# Patient Record
Sex: Female | Born: 1970 | Race: White | Hispanic: No | Smoking: Never smoker
Health system: Southern US, Community
[De-identification: ages and names within clinical notes are randomized; demographics above are authoritative.]

## PROBLEM LIST (undated history)

## (undated) DIAGNOSIS — F32A Depression, unspecified: Secondary | ICD-10-CM

## (undated) DIAGNOSIS — F419 Anxiety disorder, unspecified: Secondary | ICD-10-CM

## (undated) DIAGNOSIS — IMO0002 Reserved for concepts with insufficient information to code with codable children: Secondary | ICD-10-CM

## (undated) DIAGNOSIS — F329 Major depressive disorder, single episode, unspecified: Secondary | ICD-10-CM

## (undated) HISTORY — PX: TUBAL LIGATION: SHX77

---

## 2011-12-27 ENCOUNTER — Encounter (HOSPITAL_COMMUNITY): Payer: Self-pay | Admitting: Emergency Medicine

## 2011-12-27 ENCOUNTER — Emergency Department (HOSPITAL_COMMUNITY)
Admission: EM | Admit: 2011-12-27 | Discharge: 2011-12-27 | Disposition: A | Discharge status: Home / Self Care | Payer: Medicaid Other | Attending: Emergency Medicine | Admitting: Emergency Medicine

## 2011-12-27 DIAGNOSIS — L02419 Cutaneous abscess of limb, unspecified: Secondary | ICD-10-CM | POA: Insufficient documentation

## 2011-12-27 DIAGNOSIS — L0291 Cutaneous abscess, unspecified: Secondary | ICD-10-CM

## 2011-12-27 HISTORY — DX: Reserved for concepts with insufficient information to code with codable children: IMO0002

## 2011-12-27 MED ORDER — LIDOCAINE HCL (PF) 1 % IJ SOLN
5.0000 mL | Freq: Once | INTRAMUSCULAR | Status: AC
  Administered 2011-12-27: 5 mL via INTRADERMAL
  Filled 2011-12-27: qty 5

## 2011-12-27 MED ORDER — DOXYCYCLINE HYCLATE 100 MG PO TABS
100.0000 mg | ORAL_TABLET | Freq: Once | ORAL | Status: AC
  Administered 2011-12-27: 100 mg via ORAL
  Filled 2011-12-27: qty 1

## 2011-12-27 MED ORDER — HYDROCODONE-ACETAMINOPHEN 5-325 MG PO TABS: ORAL_TABLET | ORAL | Status: AC

## 2011-12-27 MED ORDER — HYDROCODONE-ACETAMINOPHEN 5-325 MG PO TABS
1.0000 | ORAL_TABLET | Freq: Once | ORAL | Status: AC
  Administered 2011-12-27: 1 via ORAL
  Filled 2011-12-27: qty 1

## 2011-12-27 MED ORDER — DOXYCYCLINE HYCLATE 100 MG PO CAPS: 100.0000 mg | ORAL_CAPSULE | Freq: Two times a day (BID) | ORAL | Status: AC

## 2011-12-27 NOTE — ED Provider Notes (Signed)
History     CSN: [id number]  Arrival date & time 12/27/11  2107   First MD Initiated Contact with Patient 12/27/11 2141      Chief Complaint  Patient presents with  . Insect Bite    (Consider location/radiation/quality/duration/timing/severity/associated sxs/prior treatment) HPI Comments: Patient complains of a red swelling area to her right inner thigh. She states that she was bitten by a" brown spider" she did not actually see the spider bite her. She denies fever, chills, numbness or weakness. She has not taken any medications or applied any medications to the affected area.  Patient is a 41 y.o. female presenting with abscess. The history is provided by the patient.  Abscess  This is a new problem. The current episode started today. The onset was sudden. The problem occurs continuously. The problem has been gradually worsening. The abscess is present on the right upper leg. The problem is mild. The abscess is characterized by painfulness, swelling and redness. The patient was exposed to an insect bite/sting. The abscess first occurred at home. Pertinent negatives include no fever and no vomiting. Her past medical history does not include skin abscesses in family. There were no sick contacts. She has received no recent medical care.    Past Medical History  Diagnosis Date  . Herniated disc     History reviewed. No pertinent past surgical history.  History reviewed. No pertinent family history.  History  Substance Use Topics  . Smoking status: Never Smoker   . Smokeless tobacco: Not on file  . Alcohol Use: No    OB History    Grav Para Term Preterm Abortions TAB SAB Ect Mult Living                  Review of Systems  Constitutional: Negative for fever and chills.  Gastrointestinal: Negative for nausea and vomiting.  Musculoskeletal: Negative for joint swelling and arthralgias.  Skin: Positive for color change.       Abscess   Hematological: Negative for adenopathy.   All other systems reviewed and are negative.    Allergies  Review of patient's allergies indicates no known allergies.  Home Medications  No current outpatient prescriptions on file.  BP 133/79  Pulse 78  Temp 98.6 F (37 C) (Oral)  Resp 20  Ht 5\' 2"  (1.575 m)  Wt 140 lb (63.504 kg)  BMI 25.61 kg/m2  SpO2 99%  LMP 12/27/2011  Physical Exam  Nursing note and vitals reviewed. Constitutional: She is oriented to person, place, and time. She appears well-developed and well-nourished. No distress.  HENT:  Head: Normocephalic and atraumatic.  Cardiovascular: Normal rate, regular rhythm and normal heart sounds.   Pulmonary/Chest: Effort normal and breath sounds normal.  Neurological: She is alert and oriented to person, place, and time. She exhibits normal muscle tone. Coordination normal.  Skin: Skin is warm. There is erythema.          Abscess to the right medial thigh, moderate induration and surrounding erythema.  No drainage or lymphangitis    ED Course  Procedures (including critical care time)  Labs Reviewed - No data to display No results found.      MDM   INCISION AND DRAINAGE Performed by: Maxwell Caul. Consent: Verbal consent obtained. Risks and benefits: risks, benefits and alternatives were discussed Type: abscess  Body area: right inner thigh Anesthesia: local infiltration  Local anesthetic: lidocaine1% w/o epinephrine  Anesthetic total: 3 ml  Complexity: complex Blunt dissection to break up  loculations  Drainage: purulent  Drainage amount: moderate Packing material: 1/4 in iodoform gauze  Patient tolerance: Patient tolerated the procedure well with no immediate complications.    Patient is well-appearing, vital signs are stable. Nontoxic appearing .  She ambulates with a steady gait. Patient reports a history of a spider bite earlier today, but area appears more consistent with an abscess. She agrees to return here in 2 days for  recheck and packing removal  The patient appears reasonably screened and/or stabilized for discharge and I doubt any other medical condition or other [facility] requiring further screening, evaluation, or treatment in the ED at this time prior to discharge.   Prescribed: Doxycycline Norco #20      Archibald Marchetta L. Fed Ceci, [place] 12/27/11 2251

## 2011-12-27 NOTE — ED Notes (Signed)
Patient states she was bitten by a "brown" spider approximately 1 hour ago on the inside thigh of right leg. States area is swollen and painful.

## 2011-12-28 NOTE — ED Provider Notes (Signed)
Medical screening examination/treatment/procedure(s) were performed by non-physician practitioner and as supervising physician I was immediately available for consultation/collaboration.  Doug Sou, MD 12/28/11 0111

## 2011-12-30 ENCOUNTER — Encounter (HOSPITAL_COMMUNITY): Payer: Self-pay | Admitting: *Deleted

## 2011-12-30 ENCOUNTER — Emergency Department (HOSPITAL_COMMUNITY)
Admission: EM | Admit: 2011-12-30 | Discharge: 2011-12-30 | Disposition: A | Discharge status: Home / Self Care | Payer: Medicaid Other | Attending: Emergency Medicine | Admitting: Emergency Medicine

## 2011-12-30 DIAGNOSIS — L02419 Cutaneous abscess of limb, unspecified: Secondary | ICD-10-CM | POA: Insufficient documentation

## 2011-12-30 DIAGNOSIS — Z5189 Encounter for other specified aftercare: Secondary | ICD-10-CM

## 2011-12-30 DIAGNOSIS — Z4800 Encounter for change or removal of nonsurgical wound dressing: Secondary | ICD-10-CM | POA: Insufficient documentation

## 2011-12-30 DIAGNOSIS — L03119 Cellulitis of unspecified part of limb: Secondary | ICD-10-CM | POA: Insufficient documentation

## 2011-12-30 NOTE — ED Notes (Signed)
Pt was seen in er on Wednesday for ? Spider bite to right inner thigh area, pt states that the area was "opened" on Wednesday, is here today for recheck and ?packing removal, pt states that the redness has spread since being seen in er on Wednesday,

## 2011-12-30 NOTE — ED Provider Notes (Signed)
History     CSN: [id number]  Arrival date & time 12/30/11  0830   First MD Initiated Contact with Patient 12/30/11 [phone]      Chief Complaint  Patient presents with  . Wound Check    (Consider location/radiation/quality/duration/timing/severity/associated sxs/prior treatment) HPI Comments: Taking doxycycline   Patient is a 41 y.o. female presenting with wound check. The history is provided by the patient. No language interpreter was used.  Wound Check  She was treated in the ED 2 to 3 days ago. Treatments since wound repair include oral antibiotics. Her temperature was unmeasured prior to arrival. There has been colored discharge from the wound. The redness has not changed. The swelling has improved. The pain has improved.    Past Medical History  Diagnosis Date  . Herniated disc     History reviewed. No pertinent past surgical history.  No family history on file.  History  Substance Use Topics  . Smoking status: Never Smoker   . Smokeless tobacco: Not on file  . Alcohol Use: No    OB History    Grav Para Term Preterm Abortions TAB SAB Ect Mult Living                  Review of Systems  Constitutional: Negative for fever.  Skin: Positive for wound.  All other systems reviewed and are negative.    Allergies  Review of patient's allergies indicates no known allergies.  Home Medications   Current Outpatient Rx  Name Route Sig Dispense Refill  . DOXYCYCLINE HYCLATE 100 MG PO CAPS Oral Take 1 capsule (100 mg total) by mouth 2 (two) times daily. 20 capsule 0  . HYDROCODONE-ACETAMINOPHEN 5-325 MG PO TABS  Take one-two tabs po q 4-6 hrs prn pain 20 tablet 0    BP 116/71  Pulse 92  Temp 98 F (36.7 C) (Oral)  Resp 16  Ht 5\' 2"  (1.575 m)  Wt 140 lb (63.504 kg)  BMI 25.61 kg/m2  SpO2 96%  LMP 12/27/2011  Physical Exam  Nursing note and vitals reviewed. Constitutional: She is oriented to person, place, and time. She appears well-developed and  well-nourished. No distress.  HENT:  Head: Normocephalic and atraumatic.  Eyes: EOM are normal.  Neck: Normal range of motion.  Cardiovascular: Normal rate, regular rhythm and normal heart sounds.   Pulmonary/Chest: Effort normal and breath sounds normal.  Abdominal: Soft. She exhibits no distension. There is no tenderness.  Musculoskeletal: Normal range of motion.       Legs: Neurological: She is alert and oriented to person, place, and time.  Skin: Skin is warm and dry.  Psychiatric: She has a normal mood and affect. Judgment normal.    ED Course  Procedures (including critical care time)  Labs Reviewed - No data to display No results found.   1. Wound check, abscess     Using suture removal kit ~ 1 inch of packing removed by me.  Pt has been instructed to remove 1-1.5 inches of packing daily until gone.  MDM  Continue abs.  Remove 1-1.5 inches of packing daily until gone. Warm compresses.  Return prn.        Evalina Field, PA 12/30/11 0913  Evalina Field, PA 12/30/11 [phone]

## 2011-12-30 NOTE — ED Provider Notes (Signed)
Medical screening examination/treatment/procedure(s) were performed by non-physician practitioner and as supervising physician I was immediately available for consultation/collaboration.  Tyriana Helmkamp, MD 12/30/11 0945 

## 2011-12-30 NOTE — ED Notes (Signed)
Packing removal from right upper leg.  Area red, warm to touch.

## 2011-12-30 NOTE — ED Notes (Signed)
Pt able to state how to remove and cut the packing as needed for removal of 1-1.5 inch per day, pt verbalized understanding of discharge instructions.

## 2012-01-16 ENCOUNTER — Emergency Department (HOSPITAL_COMMUNITY): Payer: Medicaid Other

## 2012-01-16 ENCOUNTER — Encounter (HOSPITAL_COMMUNITY): Payer: Self-pay | Admitting: *Deleted

## 2012-01-16 ENCOUNTER — Emergency Department (HOSPITAL_COMMUNITY)
Admission: EM | Admit: 2012-01-16 | Discharge: 2012-01-16 | Disposition: A | Discharge status: Home / Self Care | Payer: Medicaid Other | Attending: Emergency Medicine | Admitting: Emergency Medicine

## 2012-01-16 DIAGNOSIS — S93409A Sprain of unspecified ligament of unspecified ankle, initial encounter: Secondary | ICD-10-CM | POA: Insufficient documentation

## 2012-01-16 DIAGNOSIS — X58XXXA Exposure to other specified factors, initial encounter: Secondary | ICD-10-CM | POA: Insufficient documentation

## 2012-01-16 DIAGNOSIS — S93401A Sprain of unspecified ligament of right ankle, initial encounter: Secondary | ICD-10-CM

## 2012-01-16 MED ORDER — TRAMADOL HCL 50 MG PO TABS: 50.0000 mg | ORAL_TABLET | Freq: Four times a day (QID) | ORAL | Status: AC | PRN

## 2012-01-16 NOTE — ED Notes (Signed)
Pt c/o pain to right ankle and foot, states that she twisted her right foot and ankle three days ago and continues to have pain.

## 2012-01-16 NOTE — ED Notes (Signed)
Pain rt foot for 3 days , twisted rt foot and ankle, Pain "shoots up my leg"  No swelling.

## 2012-01-16 NOTE — ED Provider Notes (Signed)
History     CSN: [id number]  Arrival date & time 01/16/12  1639   First MD Initiated Contact with Patient 01/16/12 1740      Chief Complaint  Patient presents with  . Foot Pain    (Consider location/radiation/quality/duration/timing/severity/associated sxs/prior treatment) Patient is a 41 y.o. female presenting with lower extremity pain. The history is provided by the patient.  Foot Pain This is a new problem. The current episode started in the past 7 days. The problem occurs daily. The problem has been gradually worsening. Associated symptoms include joint swelling. Pertinent negatives include no abdominal pain, arthralgias, chest pain, coughing, neck pain or numbness. The symptoms are aggravated by standing, walking and twisting. She has tried nothing for the symptoms. The treatment provided no relief.    Past Medical History  Diagnosis Date  . Herniated disc     History reviewed. No pertinent past surgical history.  History reviewed. No pertinent family history.  History  Substance Use Topics  . Smoking status: Never Smoker   . Smokeless tobacco: Not on file  . Alcohol Use: No    OB History    Grav Para Term Preterm Abortions TAB SAB Ect Mult Living                  Review of Systems  Constitutional: Negative for activity change.       All ROS Neg except as noted in HPI  HENT: Negative for nosebleeds and neck pain.   Eyes: Negative for photophobia and discharge.  Respiratory: Negative for cough, shortness of breath and wheezing.   Cardiovascular: Negative for chest pain and palpitations.  Gastrointestinal: Negative for abdominal pain and blood in stool.  Genitourinary: Negative for dysuria, frequency and hematuria.  Musculoskeletal: Positive for joint swelling. Negative for back pain and arthralgias.  Skin: Negative.   Neurological: Negative for dizziness, seizures, speech difficulty and numbness.  Psychiatric/Behavioral: Negative for hallucinations and  confusion.    Allergies  Flexeril; Motrin; and Tylenol  Home Medications  No current outpatient prescriptions on file.  BP 153/97  Pulse 68  Temp 98.2 F (36.8 C) (Oral)  Resp 20  Ht 5\' 2"  (1.575 m)  Wt 140 lb (63.504 kg)  BMI 25.61 kg/m2  SpO2 99%  LMP 01/11/2012  Physical Exam  Nursing note and vitals reviewed. Constitutional: She is oriented to person, place, and time. She appears well-developed and well-nourished.  Non-toxic appearance.  HENT:  Head: Normocephalic.  Right Ear: Tympanic membrane and external ear normal.  Left Ear: Tympanic membrane and external ear normal.  Eyes: EOM and lids are normal. Pupils are equal, round, and reactive to light.  Neck: Normal range of motion. Neck supple. Carotid bruit is not present.  Cardiovascular: Normal rate, regular rhythm, normal heart sounds, intact distal pulses and normal pulses.   Pulmonary/Chest: Breath sounds normal. No respiratory distress.  Abdominal: Soft. Bowel sounds are normal. There is no tenderness. There is no guarding.  Musculoskeletal: Normal range of motion.       There is pain and mild swelling of the lateral malleolus on the right. There is full range of motion of the right knee and hip. There is no tibia or fibula deformity. The Achilles tendon is intact the capillary refill on the right is less than 3 seconds. Sensory is intact  Lymphadenopathy:       Head (right side): No submandibular adenopathy present.       Head (left side): No submandibular adenopathy present.  She has no cervical adenopathy.  Neurological: She is alert and oriented to person, place, and time. She has normal strength. No cranial nerve deficit or sensory deficit.  Skin: Skin is warm and dry.  Psychiatric: She has a normal mood and affect. Her speech is normal.    ED Course  Procedures (including critical care time)  Labs Reviewed - No data to display Dg Ankle Complete Right  01/16/2012  *RADIOLOGY REPORT*  Clinical Data:  Twisting injury to the right foot and ankle 3 days ago, persistent pain.  RIGHT ANKLE - COMPLETE 3+ VIEW  Comparison: Right foot x-rays obtained concurrently.  Findings: No evidence of acute, subacute, or healed fractures. Ankle mortise intact with well-preserved joint space.  No intrinsic osseous abnormalities.  No evidence of a significant joint effusion.  IMPRESSION: Normal examination.   Original Report Authenticated By: Arnell Sieving, M.D.    Dg Foot Complete Right  01/16/2012  *RADIOLOGY REPORT*  Clinical Data: Twisting injury to the right foot and ankle 3 days ago, persistent pain.  RIGHT FOOT COMPLETE - 3+ VIEW  Comparison: Right ankle x-rays obtained concurrently.  Findings: No evidence of acute or subacute fracture or dislocation. Well-preserved joint spaces.  Well-preserved bone mineral density. No intrinsic osseous abnormalities.  IMPRESSION: Normal examination.   Original Report Authenticated By: Arnell Sieving, M.D.      1. Sprain of ankle, right       MDM  I have reviewed nursing notes, vital signs, and all appropriate lab and imaging results for this patient.  The x-ray of the right ankle is negative for fracture or dislocation the ankle mortise is intact the. The x-ray of the right foot shows no fracture or dislocation. Patient is fitted with an ankle stirrup splint. Ice pack is applied. Patient is advised to see her primary physician or the physician if not improving.      Kathie Dike, [place] 01/16/12 1758

## 2012-01-17 NOTE — ED Provider Notes (Signed)
Medical screening examination/treatment/procedure(s) were performed by non-physician practitioner and as supervising physician I was immediately available for consultation/collaboration.   Joya Gaskins, MD 01/17/12 [phone]

## 2012-02-09 ENCOUNTER — Emergency Department (HOSPITAL_COMMUNITY)
Admission: EM | Admit: 2012-02-09 | Discharge: 2012-02-09 | Disposition: A | Discharge status: Home / Self Care | Payer: Medicaid Other | Attending: Emergency Medicine | Admitting: Emergency Medicine

## 2012-02-09 ENCOUNTER — Encounter (HOSPITAL_COMMUNITY): Payer: Self-pay | Admitting: *Deleted

## 2012-02-09 DIAGNOSIS — L02419 Cutaneous abscess of limb, unspecified: Secondary | ICD-10-CM

## 2012-02-09 DIAGNOSIS — IMO0002 Reserved for concepts with insufficient information to code with codable children: Secondary | ICD-10-CM | POA: Insufficient documentation

## 2012-02-09 DIAGNOSIS — Z888 Allergy status to other drugs, medicaments and biological substances status: Secondary | ICD-10-CM | POA: Insufficient documentation

## 2012-02-09 MED ORDER — LIDOCAINE HCL (PF) 1 % IJ SOLN
INTRAMUSCULAR | Status: AC
  Filled 2012-02-09: qty 5

## 2012-02-09 MED ORDER — DOXYCYCLINE HYCLATE 100 MG PO TABS
100.0000 mg | ORAL_TABLET | Freq: Once | ORAL | Status: AC
  Administered 2012-02-09: 100 mg via ORAL
  Filled 2012-02-09: qty 1

## 2012-02-09 MED ORDER — HYDROCODONE-ACETAMINOPHEN 5-325 MG PO TABS: 1.0000 | ORAL_TABLET | Freq: Four times a day (QID) | ORAL | Status: AC | PRN

## 2012-02-09 MED ORDER — HYDROCODONE-ACETAMINOPHEN 5-325 MG PO TABS
1.0000 | ORAL_TABLET | Freq: Once | ORAL | Status: AC
  Administered 2012-02-09: 1 via ORAL
  Filled 2012-02-09: qty 1

## 2012-02-09 MED ORDER — DOXYCYCLINE HYCLATE 100 MG PO CAPS: 100.0000 mg | ORAL_CAPSULE | Freq: Two times a day (BID) | ORAL | Status: DC

## 2012-02-09 NOTE — ED Provider Notes (Signed)
History     CSN: [id number]  Arrival date & time 02/09/12  1630   First MD Initiated Contact with Patient 02/09/12 1723      Chief Complaint  Patient presents with  . Abscess    (Consider location/radiation/quality/duration/timing/severity/associated sxs/prior treatment) HPI Comments: Swelling and pain for 2 days.  No fever or chills.  Has had one previous abscess.  Patient is a 41 y.o. female presenting with abscess. The history is provided by the patient. No language interpreter was used.  Abscess  This is a new problem. Episode onset: 2 days ago. The problem occurs continuously. The problem has been gradually worsening. Affected Location: R axilla. The problem is moderate. The abscess is characterized by painfulness and redness. The abscess first occurred at home. Pertinent negatives include no fever. Her past medical history does not include atopy in family or skin abscesses in family. There were no sick contacts. She has received no recent medical care.    Past Medical History  Diagnosis Date  . Herniated disc     History reviewed. No pertinent past surgical history.  History reviewed. No pertinent family history.  History  Substance Use Topics  . Smoking status: Never Smoker   . Smokeless tobacco: Not on file  . Alcohol Use: No    OB History    Grav Para Term Preterm Abortions TAB SAB Ect Mult Living                  Review of Systems  Constitutional: Negative for fever and chills.  Skin:       Abscess   All other systems reviewed and are negative.    Allergies  Flexeril; Motrin; and Tylenol  Home Medications   Current Outpatient Rx  Name Route Sig Dispense Refill  . DOXYCYCLINE HYCLATE 100 MG PO CAPS Oral Take 1 capsule (100 mg total) by mouth 2 (two) times daily. 20 capsule 0  . HYDROCODONE-ACETAMINOPHEN 5-325 MG PO TABS Oral Take 1 tablet by mouth every 6 (six) hours as needed for pain. 20 tablet 0    BP 134/88  Pulse 69  Temp 98.4 F (36.9  C) (Oral)  Resp 18  Ht 5\' 2"  (1.575 m)  Wt 140 lb (63.504 kg)  BMI 25.61 kg/m2  SpO2 100%  LMP 02/05/2012  Physical Exam  Nursing note and vitals reviewed. Constitutional: She is oriented to person, place, and time. She appears well-developed and well-nourished. No distress.  HENT:  Head: Normocephalic and atraumatic.  Eyes: EOM are normal.  Neck: Normal range of motion.  Cardiovascular: Normal rate, regular rhythm and normal heart sounds.   Pulmonary/Chest: Effort normal and breath sounds normal.  Abdominal: Soft. She exhibits no distension. There is no tenderness.  Musculoskeletal: Normal range of motion. She exhibits tenderness.  Neurological: She is alert and oriented to person, place, and time.  Skin: Skin is warm and dry.       ~ 2 x 4 cm area of redness and swelling with PT to R axilla.  + induration.  No fluctuance.  Psychiatric: She has a normal mood and affect. Judgment normal.    ED Course  Procedures (including critical care time)  Labs Reviewed - No data to display No results found.   1. Axillary abscess       MDM  rx-doxycycline, 20 Rx=-hydrocodone, 20 Warm compresses.  Return for I&D as needed.        Evalina Field, [place] 02/09/12 1743

## 2012-02-09 NOTE — ED Notes (Signed)
Patient with no complaints at this time. Respirations even and unlabored. Skin warm/dry. Discharge instructions reviewed with patient at this time. Patient given opportunity to voice concerns/ask questions. Patient discharged at this time and left Emergency Department with steady gait.   

## 2012-02-09 NOTE — ED Notes (Signed)
Swelling , redness of rt axilla.

## 2012-02-10 NOTE — ED Provider Notes (Signed)
Medical screening examination/treatment/procedure(s) were performed by non-physician practitioner and as supervising physician I was immediately available for consultation/collaboration.   Mataeo Ingwersen M Emelia Sandoval, MD 02/10/12 1752 

## 2012-03-25 ENCOUNTER — Encounter (HOSPITAL_COMMUNITY): Payer: Self-pay | Admitting: Emergency Medicine

## 2012-03-25 ENCOUNTER — Emergency Department (HOSPITAL_COMMUNITY)
Admission: EM | Admit: 2012-03-25 | Discharge: 2012-03-25 | Disposition: A | Discharge status: Home / Self Care | Payer: Medicaid Other | Attending: Emergency Medicine | Admitting: Emergency Medicine

## 2012-03-25 DIAGNOSIS — IMO0002 Reserved for concepts with insufficient information to code with codable children: Secondary | ICD-10-CM | POA: Insufficient documentation

## 2012-03-25 DIAGNOSIS — L0291 Cutaneous abscess, unspecified: Secondary | ICD-10-CM

## 2012-03-25 MED ORDER — HYDROCODONE-ACETAMINOPHEN 5-325 MG PO TABS: ORAL_TABLET | ORAL | Status: DC

## 2012-03-25 MED ORDER — SULFAMETHOXAZOLE-TRIMETHOPRIM 800-160 MG PO TABS: 1.0000 | ORAL_TABLET | Freq: Two times a day (BID) | ORAL | Status: DC

## 2012-03-25 MED ORDER — LIDOCAINE HCL (PF) 1 % IJ SOLN
5.0000 mL | Freq: Once | INTRAMUSCULAR | Status: AC
  Administered 2012-03-25: 5 mL via INTRADERMAL

## 2012-03-25 MED ORDER — LIDOCAINE HCL (PF) 1 % IJ SOLN
INTRAMUSCULAR | Status: AC
  Filled 2012-03-25: qty 5

## 2012-03-25 NOTE — ED Notes (Signed)
Abscess to right arm pit. Swollen and painful today

## 2012-03-25 NOTE — ED Notes (Signed)
Pt c/o abscess to the rt arm pit.

## 2012-03-25 NOTE — ED Provider Notes (Signed)
History     CSN: [id number]  Arrival date & time 03/25/12  1313   First MD Initiated Contact with Patient 03/25/12 1401      Chief Complaint  Patient presents with  . Abscess    (Consider location/radiation/quality/duration/timing/severity/associated sxs/prior treatment) Patient is a 41 y.o. female presenting with abscess. The history is provided by the patient.  Abscess  This is a new problem. The current episode started less than one week ago. The onset was gradual. The problem occurs continuously. The problem has been gradually worsening. Affected Location: right axilla. The problem is moderate. The abscess is characterized by painfulness, swelling and redness. Pertinent negatives include no decrease in physical activity, no fever, no vomiting and no decreased responsiveness. Her past medical history is significant for skin abscesses in family. There were no sick contacts. She has received no recent medical care.    Past Medical History  Diagnosis Date  . Herniated disc     History reviewed. No pertinent past surgical history.  History reviewed. No pertinent family history.  History  Substance Use Topics  . Smoking status: Never Smoker   . Smokeless tobacco: Not on file  . Alcohol Use: No    OB History    Grav Para Term Preterm Abortions TAB SAB Ect Mult Living                  Review of Systems  Constitutional: Negative for fever, chills and decreased responsiveness.  Gastrointestinal: Negative for nausea, vomiting and abdominal pain.  Musculoskeletal: Negative for joint swelling and arthralgias.  Skin: Positive for color change.       Abscess   Neurological: Negative for dizziness and headaches.  Hematological: Negative for adenopathy.  All other systems reviewed and are negative.    Allergies  Flexeril  Home Medications  No current outpatient prescriptions on file.  BP 123/80  Pulse 86  Temp 98 F (36.7 C) (Oral)  Resp 17  Ht 5\' 2"  (1.575 m)   Wt 140 lb (63.504 kg)  BMI 25.61 kg/m2  SpO2 99%  LMP 03/19/2012  Physical Exam  Nursing note and vitals reviewed. Constitutional: She is oriented to person, place, and time. She appears well-developed and well-nourished. No distress.  HENT:  Head: Normocephalic and atraumatic.  Cardiovascular: Normal rate, regular rhythm and normal heart sounds.   Pulmonary/Chest: Effort normal and breath sounds normal.  Neurological: She is alert and oriented to person, place, and time. She exhibits normal muscle tone. Coordination normal.  Skin: Skin is warm. There is erythema.       Abscess to the right axilla with moderate induration and surrounding erythema.  No drainage    ED Course  Procedures (including critical care time)  Labs Reviewed - No data to display No results found.      MDM    INCISION AND DRAINAGE Performed by: Maxwell Caul. Consent: Verbal consent obtained. Risks and benefits: risks, benefits and alternatives were discussed Type: abscess  Body area: right axilla Anesthesia: local infiltration  Local anesthetic: lidocaine 1 % w/o  epinephrine  Anesthetic total: 3 ml  Complexity: complex Blunt dissection to break up loculations  Drainage: purulent  Drainage amount:moderate  Packing material: 1/4 in iodoform gauze  Patient tolerance: Patient tolerated the procedure well with no immediate complications.    Pt is well appearing, vitals stable,  has hx of same.  Prefers to remove packing herself in 2 days.  She agrees to return here if sx's are worsening  Prescribed: norco #20 Bactrim DS   Alvah Lagrow L. Diva Lemberger, [place] 03/27/12 1535

## 2012-03-28 NOTE — ED Provider Notes (Signed)
Medical screening examination/treatment/procedure(s) were performed by non-physician practitioner and as supervising physician I was immediately available for consultation/collaboration.  Anyssa Sharpless, MD 03/28/12 0743 

## 2012-05-12 ENCOUNTER — Emergency Department (HOSPITAL_COMMUNITY)
Admission: EM | Admit: 2012-05-12 | Discharge: 2012-05-13 | Disposition: A | Discharge status: Home / Self Care | Payer: Medicaid Other | Attending: Emergency Medicine | Admitting: Emergency Medicine

## 2012-05-12 ENCOUNTER — Encounter (HOSPITAL_COMMUNITY): Payer: Self-pay | Admitting: *Deleted

## 2012-05-12 DIAGNOSIS — Z8739 Personal history of other diseases of the musculoskeletal system and connective tissue: Secondary | ICD-10-CM | POA: Insufficient documentation

## 2012-05-12 DIAGNOSIS — Z79899 Other long term (current) drug therapy: Secondary | ICD-10-CM | POA: Insufficient documentation

## 2012-05-12 DIAGNOSIS — L02412 Cutaneous abscess of left axilla: Secondary | ICD-10-CM

## 2012-05-12 DIAGNOSIS — IMO0002 Reserved for concepts with insufficient information to code with codable children: Secondary | ICD-10-CM | POA: Insufficient documentation

## 2012-05-12 NOTE — ED Notes (Signed)
Pt has abscess under left arm pit.  

## 2012-05-13 MED ORDER — HYDROCODONE-ACETAMINOPHEN 5-325 MG PO TABS: 1.0000 | ORAL_TABLET | ORAL | Status: AC | PRN

## 2012-05-13 MED ORDER — SULFAMETHOXAZOLE-TRIMETHOPRIM 800-160 MG PO TABS: 1.0000 | ORAL_TABLET | Freq: Two times a day (BID) | ORAL | Status: DC

## 2012-05-13 MED ORDER — LIDOCAINE HCL (PF) 2 % IJ SOLN
10.0000 mL | Freq: Once | INTRAMUSCULAR | Status: DC
  Filled 2012-05-13 (×2): qty 10

## 2012-05-13 MED ORDER — SULFAMETHOXAZOLE-TMP DS 800-160 MG PO TABS
1.0000 | ORAL_TABLET | Freq: Once | ORAL | Status: AC
  Administered 2012-05-13: 1 via ORAL
  Filled 2012-05-13: qty 1

## 2012-05-13 MED ORDER — HYDROCODONE-ACETAMINOPHEN 5-325 MG PO TABS
1.0000 | ORAL_TABLET | Freq: Once | ORAL | Status: AC
  Administered 2012-05-13: 1 via ORAL
  Filled 2012-05-13: qty 1

## 2012-05-13 NOTE — ED Provider Notes (Signed)
History     CSN: [id number]  Arrival date & time 05/12/12  2109   First MD Initiated Contact with Patient 05/13/12 0005      Chief Complaint  Patient presents with  . Abscess    (Consider location/radiation/quality/duration/timing/severity/associated sxs/prior treatment) Patient is a 42 y.o. female presenting with abscess. The history is provided by the patient.  Abscess  This is a recurrent problem. The onset was gradual. The problem occurs continuously. The problem has been gradually worsening. Affected Location: left axilla. The problem is moderate. The abscess is characterized by redness, painfulness and swelling. Pertinent negatives include no fever. Past medical history comments: Pt has positive personal history of frequent axillary abscesses..    Past Medical History  Diagnosis Date  . Herniated disc     History reviewed. No pertinent past surgical history.  History reviewed. No pertinent family history.  History  Substance Use Topics  . Smoking status: Never Smoker   . Smokeless tobacco: Not on file  . Alcohol Use: No    OB History    Grav Para Term Preterm Abortions TAB SAB Ect Mult Living                  Review of Systems  Constitutional: Negative for fever and chills.  HENT: Negative for facial swelling.   Respiratory: Negative for shortness of breath and wheezing.   Skin: Positive for color change and wound.  Neurological: Negative for numbness.    Allergies  Flexeril  Home Medications   Current Outpatient Rx  Name  Route  Sig  Dispense  Refill  . METOPROLOL TARTRATE 25 MG PO TABS   Oral   Take 25 mg by mouth 2 (two) times daily.         Marland Kitchen HYDROCODONE-ACETAMINOPHEN 5-325 MG PO TABS   Oral   Take 1 tablet by mouth every 4 (four) hours as needed for pain.   15 tablet   0   . SULFAMETHOXAZOLE-TRIMETHOPRIM 800-160 MG PO TABS   Oral   Take 1 tablet by mouth 2 (two) times daily.   28 tablet   0     BP 140/84  Pulse 84  Temp 98.2 F  (36.8 C)  Resp 20  Ht 5\' 2"  (1.575 m)  Wt 140 lb (63.504 kg)  BMI 25.61 kg/m2  SpO2 99%  LMP 05/07/2012  Physical Exam  Constitutional: She appears well-developed and well-nourished. No distress.  HENT:  Head: Normocephalic.  Neck: Neck supple.  Cardiovascular: Normal rate.   Pulmonary/Chest: Effort normal. She has no wheezes.  Musculoskeletal: Normal range of motion. She exhibits no edema.  Skin: Skin is warm and dry. There is erythema.       Abscess x 2 left mid axilla.  No active drainage,  No red streaking,  Surrounding erythema.  Fluctuant.    ED Course  Procedures (including critical care time)   Labs Reviewed  WOUND CULTURE   No results found.   1. Abscess of axilla, left    INCISION AND DRAINAGE Performed by: Burgess Amor Consent: Verbal consent obtained. Risks and benefits: risks, benefits and alternatives were discussed Type: abscess  Body area: left axilla  Anesthesia: local infiltration  Incision was made with a scalpel.  Local anesthetic: lidocaine 2% without epinephrine  Anesthetic total: 10 mL - 4 mL injected around site,  6 mL flush to interior cavity.  Complexity: complex Blunt dissection to break up loculations  Drainage: purulent  Drainage amount: moderate  Packing material: 1/4  in iodoform gauze  Patient tolerance: Patient tolerated the procedure well with no immediate complications.      MDM  Bactrim,  Warm water therapy.  Pt comfortable removing packing in 2 days. Prn f/u.  Wound culture sent.        Burgess Amor, PA 05/13/12 1331

## 2012-05-14 NOTE — ED Provider Notes (Signed)
Medical screening examination/treatment/procedure(s) were performed by non-physician practitioner and as supervising physician I was immediately available for consultation/collaboration.  Nicoletta Dress. Colon Branch, MD 05/14/12 4098

## 2012-05-16 ENCOUNTER — Telehealth (HOSPITAL_COMMUNITY): Payer: Self-pay | Admitting: Emergency Medicine

## 2012-05-16 LAB — WOUND CULTURE

## 2012-05-16 NOTE — ED Notes (Signed)
Results called from [facility].  Wound L Axilla -> (+) Moderate MRSA.  Rx given in ED for Sulfa-trimeth -> sensitive to the same.  Call and notify pt.  General FYI set for MRSA Hx

## 2012-05-17 ENCOUNTER — Telehealth (HOSPITAL_COMMUNITY): Payer: Self-pay | Admitting: Emergency Medicine

## 2012-05-19 NOTE — ED Notes (Signed)
Unable to contact patient via phone. Sent letter. °

## 2012-06-17 ENCOUNTER — Telehealth (HOSPITAL_COMMUNITY): Payer: Self-pay | Admitting: Emergency Medicine

## 2012-06-17 NOTE — ED Notes (Signed)
No response to letter sent after 30 days. Chart sent to Medical Records. °

## 2012-07-24 ENCOUNTER — Encounter (HOSPITAL_COMMUNITY): Payer: Self-pay

## 2012-07-24 ENCOUNTER — Emergency Department (HOSPITAL_COMMUNITY)
Admission: EM | Admit: 2012-07-24 | Discharge: 2012-07-24 | Disposition: A | Discharge status: Home / Self Care | Payer: Medicaid Other | Attending: Emergency Medicine | Admitting: Emergency Medicine

## 2012-07-24 DIAGNOSIS — R300 Dysuria: Secondary | ICD-10-CM | POA: Insufficient documentation

## 2012-07-24 DIAGNOSIS — Z8739 Personal history of other diseases of the musculoskeletal system and connective tissue: Secondary | ICD-10-CM | POA: Insufficient documentation

## 2012-07-24 DIAGNOSIS — Z3202 Encounter for pregnancy test, result negative: Secondary | ICD-10-CM | POA: Insufficient documentation

## 2012-07-24 DIAGNOSIS — Z79899 Other long term (current) drug therapy: Secondary | ICD-10-CM | POA: Insufficient documentation

## 2012-07-24 DIAGNOSIS — N739 Female pelvic inflammatory disease, unspecified: Secondary | ICD-10-CM | POA: Insufficient documentation

## 2012-07-24 DIAGNOSIS — N898 Other specified noninflammatory disorders of vagina: Secondary | ICD-10-CM | POA: Insufficient documentation

## 2012-07-24 LAB — URINALYSIS, ROUTINE W REFLEX MICROSCOPIC
Glucose, UA: NEGATIVE mg/dL
Ketones, ur: NEGATIVE mg/dL
Leukocytes, UA: NEGATIVE
Nitrite: NEGATIVE
Protein, ur: NEGATIVE mg/dL
pH: 6.5 (ref 5.0–8.0)

## 2012-07-24 LAB — POCT PREGNANCY, URINE: Preg Test, Ur: NEGATIVE

## 2012-07-24 LAB — WET PREP, GENITAL: Trich, Wet Prep: NONE SEEN

## 2012-07-24 MED ORDER — CEFTRIAXONE SODIUM 250 MG IJ SOLR
250.0000 mg | Freq: Once | INTRAMUSCULAR | Status: AC
  Administered 2012-07-24: 250 mg via INTRAMUSCULAR
  Filled 2012-07-24: qty 250

## 2012-07-24 MED ORDER — HYDROCODONE-ACETAMINOPHEN 5-325 MG PO TABS: ORAL_TABLET | ORAL | Status: DC

## 2012-07-24 MED ORDER — METRONIDAZOLE 500 MG PO TABS: 500.0000 mg | ORAL_TABLET | Freq: Two times a day (BID) | ORAL | Status: DC

## 2012-07-24 MED ORDER — DOXYCYCLINE HYCLATE 100 MG PO CAPS: 100.0000 mg | ORAL_CAPSULE | Freq: Two times a day (BID) | ORAL | Status: DC

## 2012-07-24 NOTE — ED Notes (Signed)
Pt c/o vaginal pain and burning with urination x 3 hours.  Denies any vaginal discharge or bleeding.

## 2012-07-24 NOTE — ED Provider Notes (Signed)
History     CSN: [id number]  Arrival date & time 07/24/12  1602   First MD Initiated Contact with Patient 07/24/12 1704      Chief Complaint  Patient presents with  . 74.5     (Consider location/radiation/quality/duration/timing/severity/associated sxs/prior treatment) HPI Comments: Patient c/o vaginal pain with burning with urination for several hours.  States she has noticed a slight increase in vaginal discharge.  She denies bleeding, abd pain, fever or vomiting.  She admits to unprotected sexual activity.    Patient is a 42 y.o. female presenting with dysuria. The history is provided by the patient.  Dysuria  This is a new problem. The current episode started 3 to 5 hours ago. The problem occurs every urination. The problem has not changed since onset.The quality of the pain is described as burning. The pain is mild. There has been no fever. She is sexually active. There is no history of pyelonephritis. Pertinent negatives include no chills, no sweats, no nausea, no vomiting, no discharge, no frequency, no hematuria, no hesitancy, no possible pregnancy, no urgency and no flank pain. She has tried nothing for the symptoms. Her past medical history does not include kidney stones, recurrent UTIs or urinary stasis.    Past Medical History  Diagnosis Date  . Herniated disc     History reviewed. No pertinent past surgical history.  No family history on file.  History  Substance Use Topics  . Smoking status: Never Smoker   . Smokeless tobacco: Not on file  . Alcohol Use: No    OB History   Grav Para Term Preterm Abortions TAB SAB Ect Mult Living                  Review of Systems  Constitutional: Negative for chills, activity change and appetite change.  Gastrointestinal: Negative for nausea, vomiting and abdominal pain.  Genitourinary: Positive for dysuria and vaginal discharge. Negative for hesitancy, urgency, frequency, hematuria, flank pain, vaginal bleeding,  difficulty urinating, genital sores and pelvic pain.  Musculoskeletal: Negative for back pain.  Skin: Negative for rash.  All other systems reviewed and are negative.    Allergies  Flexeril  Home Medications   Current Outpatient Rx  Name  Route  Sig  Dispense  Refill  . metoprolol tartrate (LOPRESSOR) 25 MG tablet   Oral   Take 25 mg by mouth 2 (two) times daily.         Marland Kitchen sulfamethoxazole-trimethoprim (SEPTRA DS) 800-160 MG per tablet   Oral   Take 1 tablet by mouth 2 (two) times daily.   28 tablet   0     BP 121/90  Pulse 73  Temp(Src) 97.5 F (36.4 C) (Oral)  Resp 16  Ht 5\' 2"  (1.575 m)  Wt 140 lb (63.504 kg)  BMI 25.6 kg/m2  SpO2 100%  LMP 07/03/2012  Physical Exam  Nursing note and vitals reviewed. Constitutional: She is oriented to person, place, and time. She appears well-developed and well-nourished. No distress.  HENT:  Head: Normocephalic and atraumatic.  Neck: Normal range of motion. Neck supple.  Cardiovascular: Normal rate, regular rhythm, normal heart sounds and intact distal pulses.   No murmur heard. Pulmonary/Chest: Effort normal and breath sounds normal. No respiratory distress.  Abdominal: Soft. She exhibits no distension and no mass. There is no tenderness. There is no rebound and no guarding.  Genitourinary: Uterus normal. There is no tenderness or lesion on the right labia. There is no tenderness or lesion  on the left labia. Uterus is not enlarged. Cervix exhibits motion tenderness. Cervix exhibits no discharge and no friability. Right adnexum displays no mass and no tenderness. Left adnexum displays no mass and no tenderness. No erythema, tenderness or bleeding around the vagina. No foreign body around the vagina. No signs of injury around the vagina. Vaginal discharge found.  Musculoskeletal: Normal range of motion. She exhibits no edema.  Lymphadenopathy:    She has no cervical adenopathy.  Neurological: She is alert and oriented to  person, place, and time. She exhibits normal muscle tone. Coordination normal.  Skin: Skin is warm and dry.    ED Course  Procedures (including critical care time)  Results for orders placed during the hospital encounter of 07/24/12  URINALYSIS, ROUTINE W REFLEX MICROSCOPIC      Result Value Range   Color, Urine YELLOW  YELLOW   APPearance CLEAR  CLEAR   Specific Gravity, Urine 1.025  1.005 - 1.030   pH 6.5  5.0 - 8.0   Glucose, UA NEGATIVE  NEGATIVE mg/dL   Hgb urine dipstick NEGATIVE  NEGATIVE   Bilirubin Urine NEGATIVE  NEGATIVE   Ketones, ur NEGATIVE  NEGATIVE mg/dL   Protein, ur NEGATIVE  NEGATIVE mg/dL   Urobilinogen, UA 0.2  0.0 - 1.0 mg/dL   Nitrite NEGATIVE  NEGATIVE   Leukocytes, UA NEGATIVE  NEGATIVE  POCT PREGNANCY, URINE      Result Value Range   Preg Test, Ur NEGATIVE  NEGATIVE     GC and Chlamydia culture pending    MDM    Vitals stable, pt is well appearing.  Ambulates with a steady gait.  Abd is soft and NT, no CVA tenderness, U/A is negative.  She does have CMT with a white vaginal discharge.  Likely vaginitis, but since CMT is present and vaginal d/c I will treat for PID.  Pt given IM rocephin, prescribed doxy, flagyl and norco # 12  The patient appears reasonably screened and/or stabilized for discharge and I doubt any other medical condition or other [facility] requiring further screening, evaluation, or treatment in the ED at this time prior to discharge.      Javad Salva L. Trisha Mangle, PA-C 07/26/12 1453

## 2012-07-25 LAB — GC/CHLAMYDIA PROBE AMP: CT Probe RNA: NEGATIVE

## 2012-07-29 NOTE — ED Provider Notes (Signed)
Medical screening examination/treatment/procedure(s) were performed by non-physician practitioner and as supervising physician I was immediately available for consultation/collaboration.   Laray Anger, DO 07/29/12 0028

## 2012-11-23 ENCOUNTER — Encounter (HOSPITAL_COMMUNITY): Payer: Self-pay | Admitting: *Deleted

## 2012-11-23 ENCOUNTER — Emergency Department (HOSPITAL_COMMUNITY)
Admission: EM | Admit: 2012-11-23 | Discharge: 2012-11-23 | Discharge status: Against Medical Advice | Payer: Medicaid Other | Attending: Emergency Medicine | Admitting: Emergency Medicine

## 2012-11-23 DIAGNOSIS — Y929 Unspecified place or not applicable: Secondary | ICD-10-CM | POA: Insufficient documentation

## 2012-11-23 DIAGNOSIS — S6990XA Unspecified injury of unspecified wrist, hand and finger(s), initial encounter: Secondary | ICD-10-CM | POA: Insufficient documentation

## 2012-11-23 DIAGNOSIS — Y939 Activity, unspecified: Secondary | ICD-10-CM | POA: Insufficient documentation

## 2012-11-23 DIAGNOSIS — S59909A Unspecified injury of unspecified elbow, initial encounter: Secondary | ICD-10-CM | POA: Insufficient documentation

## 2012-11-23 DIAGNOSIS — W108XXA Fall (on) (from) other stairs and steps, initial encounter: Secondary | ICD-10-CM | POA: Insufficient documentation

## 2012-11-23 NOTE — ED Notes (Addendum)
Fell off steps directly onto L elbow on concrete sidewalk.  Pain radiating into L hand.

## 2012-11-26 ENCOUNTER — Emergency Department (HOSPITAL_COMMUNITY): Payer: Medicaid Other

## 2012-11-26 ENCOUNTER — Emergency Department (HOSPITAL_COMMUNITY)
Admission: EM | Admit: 2012-11-26 | Discharge: 2012-11-26 | Disposition: A | Discharge status: Home / Self Care | Payer: Medicaid Other | Attending: Emergency Medicine | Admitting: Emergency Medicine

## 2012-11-26 ENCOUNTER — Encounter (HOSPITAL_COMMUNITY): Payer: Self-pay | Admitting: *Deleted

## 2012-11-26 DIAGNOSIS — S8991XA Unspecified injury of right lower leg, initial encounter: Secondary | ICD-10-CM

## 2012-11-26 DIAGNOSIS — Z8739 Personal history of other diseases of the musculoskeletal system and connective tissue: Secondary | ICD-10-CM | POA: Insufficient documentation

## 2012-11-26 DIAGNOSIS — S8990XA Unspecified injury of unspecified lower leg, initial encounter: Secondary | ICD-10-CM | POA: Insufficient documentation

## 2012-11-26 DIAGNOSIS — Y9289 Other specified places as the place of occurrence of the external cause: Secondary | ICD-10-CM | POA: Insufficient documentation

## 2012-11-26 DIAGNOSIS — Z3202 Encounter for pregnancy test, result negative: Secondary | ICD-10-CM | POA: Insufficient documentation

## 2012-11-26 DIAGNOSIS — Y9389 Activity, other specified: Secondary | ICD-10-CM | POA: Insufficient documentation

## 2012-11-26 DIAGNOSIS — W07XXXA Fall from chair, initial encounter: Secondary | ICD-10-CM | POA: Insufficient documentation

## 2012-11-26 LAB — POCT PREGNANCY, URINE: Preg Test, Ur: NEGATIVE

## 2012-11-26 MED ORDER — NAPROXEN 500 MG PO TABS: 500.0000 mg | ORAL_TABLET | Freq: Two times a day (BID) | ORAL | Status: DC

## 2012-11-26 MED ORDER — OXYCODONE-ACETAMINOPHEN 5-325 MG PO TABS: 1.0000 | ORAL_TABLET | ORAL | Status: DC | PRN

## 2012-11-26 NOTE — ED Provider Notes (Signed)
History    CSN: [id number] Arrival date & time 11/26/12  2041  First MD Initiated Contact with Patient 11/26/12 2109     Chief Complaint  Patient presents with  . Leg Injury   (Consider location/radiation/quality/duration/timing/severity/associated sxs/prior Treatment) HPI Comments: Ann Guzman is a 42 y.o. female who presents to the Emergency Department complaining of sudden onset of sharp pain to her right lower leg that began secondary to a fall.  She states she was standing in a chair and fell.  Describes a sharp pain to her posterior lower leg that's worse with palpation of the calf and with weight bearing.  She states the pain radiates down toward her ankle. She also c/o swelling to her calf.  She denies back pain, neck pain, head injury, dizziness or LOC.    The history is provided by the patient.   Past Medical History  Diagnosis Date  . Herniated disc    History reviewed. No pertinent past surgical history. No family history on file. History  Substance Use Topics  . Smoking status: Never Smoker   . Smokeless tobacco: Not on file  . Alcohol Use: No   OB History   Grav Para Term Preterm Abortions TAB SAB Ect Mult Living                 Review of Systems  Constitutional: Negative for fever and chills.  HENT: Negative for neck pain.   Genitourinary: Negative for dysuria and difficulty urinating.  Musculoskeletal: Positive for joint swelling, arthralgias and gait problem. Negative for back pain.  Skin: Negative for color change and wound.  Neurological: Negative for dizziness, syncope, weakness, numbness and headaches.  All other systems reviewed and are negative.    Allergies  Flexeril and Hydrocodone  Home Medications  No current outpatient prescriptions on file. BP 124/70  Pulse 63  Temp(Src) 98.3 F (36.8 C) (Oral)  Resp 20  Ht 5\' 2"  (1.575 m)  Wt 140 lb (63.504 kg)  BMI 25.6 kg/m2  SpO2 100%  LMP 11/05/2012 Physical Exam  Nursing note and  vitals reviewed. Constitutional: She is oriented to person, place, and time. She appears well-developed and well-nourished. No distress.  HENT:  Head: Normocephalic and atraumatic.  Neck: Normal range of motion. Neck supple.  Cardiovascular: Normal rate, regular rhythm, normal heart sounds and intact distal pulses.   Pulmonary/Chest: Effort normal and breath sounds normal. No respiratory distress.  Musculoskeletal: She exhibits tenderness.       Right lower leg: She exhibits tenderness and swelling. She exhibits no bony tenderness, no deformity and no laceration.       Legs: ttp of the medial aspect of posterior right lower leg. Mild STS present, compartments soft.  Thompson test is negative.  Achilles tendon appears intact.   No erythema, bruising or bony deformity.  DP pulse brisk, distal sensation intact    Neurological: She is alert and oriented to person, place, and time. She exhibits normal muscle tone. Coordination normal.  Skin: Skin is warm and dry. No erythema.    ED Course  Procedures (including critical care time) Labs Reviewed - No data to display Dg Knee Complete 4 Views Right  11/26/2012   *RADIOLOGY REPORT*  Clinical Data: Pain post trauma  RIGHT KNEE - COMPLETE 4+ VIEW  Comparison: None.  Findings:  Frontal, lateral, and bilateral oblique views were obtained.  There is no fracture, dislocation, or effusion.  Joint spaces appear intact.  No erosive change.  IMPRESSION: No abnormality  noted.   Original Report Authenticated By: Bretta Bang, M.D.     MDM   Consulted Dr. Hilda Lias, requested MRI of leg.  Scheduled as outpatient for Thursday 11/29/12 at 9:00 am  ttp of the right lower leg.  Mild to moderate STS w/o erythema.  Compartments are soft, NV intact.  Possible gastroc or soleus injury.    Knee immobilizer applied, crutches given.  Patient instructed to be non-weight bearing.  She agrees to schedule f/u with Dr. Hilda Lias in his office after MRI.    Sherrie Marsan L.  Trisha Mangle, PA-C 11/28/12 1247

## 2012-11-26 NOTE — ED Notes (Signed)
Pt states fell out of a chair & complaining of right leg pain. No deformity or swelling noted

## 2012-11-26 NOTE — ED Notes (Signed)
Pain rt lower leg and knee, fell out of chair.  No visible injury.Good pedal pulse.

## 2012-11-28 NOTE — ED Provider Notes (Signed)
Medical screening examination/treatment/procedure(s) were performed by non-physician practitioner and as supervising physician I was immediately available for consultation/collaboration.  Lyanne Co, MD 11/28/12 [phone]

## 2012-11-29 ENCOUNTER — Emergency Department (HOSPITAL_COMMUNITY)
Admission: EM | Admit: 2012-11-29 | Discharge: 2012-11-29 | Disposition: A | Discharge status: Home / Self Care | Payer: Medicaid Other | Attending: Emergency Medicine | Admitting: Emergency Medicine

## 2012-11-29 ENCOUNTER — Ambulatory Visit (HOSPITAL_COMMUNITY): Payer: Medicaid Other

## 2012-11-29 ENCOUNTER — Encounter (HOSPITAL_COMMUNITY): Payer: Self-pay | Admitting: *Deleted

## 2012-11-29 DIAGNOSIS — Z87828 Personal history of other (healed) physical injury and trauma: Secondary | ICD-10-CM | POA: Insufficient documentation

## 2012-11-29 DIAGNOSIS — Z8739 Personal history of other diseases of the musculoskeletal system and connective tissue: Secondary | ICD-10-CM | POA: Insufficient documentation

## 2012-11-29 DIAGNOSIS — M79609 Pain in unspecified limb: Secondary | ICD-10-CM | POA: Insufficient documentation

## 2012-11-29 MED ORDER — OXYCODONE-ACETAMINOPHEN 5-325 MG PO TABS: 1.0000 | ORAL_TABLET | ORAL | Status: DC | PRN

## 2012-11-29 NOTE — ED Provider Notes (Signed)
Medical screening examination/treatment/procedure(s) were performed by non-physician practitioner and as supervising physician I was immediately available for consultation/collaboration.   Benny Lennert, MD 11/29/12 [phone]

## 2012-11-29 NOTE — ED Provider Notes (Signed)
   History    CSN: [id number] Arrival date & time 11/29/12  0918  First MD Initiated Contact with Patient 11/29/12 [phone]     Chief Complaint  Patient presents with  . Medication Refill   (Consider location/radiation/quality/duration/timing/severity/associated sxs/prior Treatment) HPI Comments: Ann Guzman is a 42 y.o. Female presenting for a medication refill.  She was seen here 3 days ago and diagnosed with a right lower extremity injury,  Possibly ligament or muscle tear after falling from a chair and twisting this leg and knee.  She was supposed to see Dr. Hilda Lias who also requested an MRI of her leg but has been unable to get the test or see him as she needs referral by a pcp due to her medicaid requirements.  She does not have a pcp,  But plans to establish care with [place] so she can proceed with her workup.  In the interim,  She reports continued but no worsened pain and has run out of her pain medicine.  She denies numbness or weakness distal to the injury site.  She has increased pain in the calf with foot dorsi and plantar flexion.  She is using her knee immobilizer and crutches to minimize weight bearing.     The history is provided by the patient.   Past Medical History  Diagnosis Date  . Herniated disc    History reviewed. No pertinent past surgical history. No family history on file. History  Substance Use Topics  . Smoking status: Never Smoker   . Smokeless tobacco: Not on file  . Alcohol Use: No   OB History   Grav Para Term Preterm Abortions TAB SAB Ect Mult Living                 Review of Systems  Constitutional: Negative for fever.  Musculoskeletal: Positive for myalgias and arthralgias. Negative for joint swelling.  Neurological: Negative for weakness and numbness.    Allergies  Flexeril and Hydrocodone  Home Medications   Current Outpatient Rx  Name  Route  Sig  Dispense  Refill  . oxyCODONE-acetaminophen (PERCOCET/ROXICET) 5-325 MG  per tablet   Oral   Take 1 tablet by mouth every 4 (four) hours as needed for pain.   20 tablet   0    BP 143/85  Pulse 60  Temp(Src) 98.3 F (36.8 C) (Oral)  Resp 16  SpO2 100%  LMP 11/05/2012 Physical Exam  Constitutional: She appears well-developed and well-nourished.  HENT:  Head: Atraumatic.  Neck: Normal range of motion.  Cardiovascular:  Pulses equal bilaterally  Musculoskeletal: She exhibits edema and tenderness.  ttp right posterior calf with mild edema,  No ecchymosis, induration,  Or erythema.  Compartments feel soft.  Increased pain with deep palpation.  Achilles tendon intact.  Distal sensation intact,  Dorsalis pedis pulse full.  Neurological: She is alert. She has normal strength. She displays normal reflexes. No sensory deficit.  Equal strength  Skin: Skin is warm and dry.  Psychiatric: She has a normal mood and affect.    ED Course  Procedures (including critical care time) Labs Reviewed - No data to display No results found. 1. Lower leg pain, right     MDM  Oxycodone prescribed.  Encouraged elevation,  Knee immobilizer, crutches. F/u as planned.  Exam is negative for any sign of compartment syndrome.  Advised to watch for increased pain or swelling,  Numbness or color change distally.    Burgess Amor, PA-C 11/29/12 1051

## 2012-11-29 NOTE — ED Notes (Signed)
Fall x 3 days ago, seen and treated, dx with pulled ligaments to right knee.  Suppose to have MRI today, unable due to insurance reasons.  Has not followed up with ortho due to insurance reasons.  Ran out of pain meds this morning.  Here for refill.

## 2013-01-12 ENCOUNTER — Emergency Department (HOSPITAL_COMMUNITY): Payer: Medicaid Other

## 2013-01-12 ENCOUNTER — Emergency Department (HOSPITAL_COMMUNITY)
Admission: EM | Admit: 2013-01-12 | Discharge: 2013-01-12 | Disposition: A | Discharge status: Home / Self Care | Payer: Medicaid Other | Attending: Emergency Medicine | Admitting: Emergency Medicine

## 2013-01-12 ENCOUNTER — Encounter (HOSPITAL_COMMUNITY): Payer: Self-pay

## 2013-01-12 DIAGNOSIS — Y92009 Unspecified place in unspecified non-institutional (private) residence as the place of occurrence of the external cause: Secondary | ICD-10-CM | POA: Insufficient documentation

## 2013-01-12 DIAGNOSIS — S300XXA Contusion of lower back and pelvis, initial encounter: Secondary | ICD-10-CM

## 2013-01-12 DIAGNOSIS — S20229A Contusion of unspecified back wall of thorax, initial encounter: Secondary | ICD-10-CM | POA: Insufficient documentation

## 2013-01-12 DIAGNOSIS — Z8739 Personal history of other diseases of the musculoskeletal system and connective tissue: Secondary | ICD-10-CM | POA: Insufficient documentation

## 2013-01-12 DIAGNOSIS — Y93K9 Activity, other involving animal care: Secondary | ICD-10-CM | POA: Insufficient documentation

## 2013-01-12 DIAGNOSIS — W108XXA Fall (on) (from) other stairs and steps, initial encounter: Secondary | ICD-10-CM | POA: Insufficient documentation

## 2013-01-12 MED ORDER — MELOXICAM 7.5 MG PO TABS: 7.5000 mg | ORAL_TABLET | Freq: Every day | ORAL | Status: DC

## 2013-01-12 MED ORDER — METHOCARBAMOL 500 MG PO TABS: 500.0000 mg | ORAL_TABLET | Freq: Two times a day (BID) | ORAL | Status: DC

## 2013-01-12 MED ORDER — OXYCODONE-ACETAMINOPHEN 5-325 MG PO TABS
1.0000 | ORAL_TABLET | Freq: Once | ORAL | Status: AC
  Administered 2013-01-12: 1 via ORAL
  Filled 2013-01-12: qty 1

## 2013-01-12 NOTE — ED Provider Notes (Signed)
Medical screening examination/treatment/procedure(s) were performed by non-physician practitioner and as supervising physician I was immediately available for consultation/collaboration.  Vernel Donlan, MD 01/12/13 2318 

## 2013-01-12 NOTE — ED Provider Notes (Signed)
CSN: [id number]     Arrival date & time 01/12/13  2109 History   First MD Initiated Contact with Patient 01/12/13 2148     Chief Complaint  Patient presents with  . Back Pain  . Fall   (Consider location/radiation/quality/duration/timing/severity/associated sxs/prior Treatment) Patient is a 42 y.o. female presenting with back pain and fall. The history is provided by the patient.  Back Pain Location:  Lumbar spine Quality:  Shooting Radiates to:  Does not radiate Pain severity:  Severe (10/10) Pain is:  Same all the time Onset quality:  Sudden Duration:  2 hours Progression:  Worsening Chronicity:  New Context: falling   Relieved by:  Nothing Worsened by:  Movement and twisting Associated symptoms: no abdominal pain, no bladder incontinence, no bowel incontinence, no dysuria, no fever, no headaches, no leg pain, no numbness, no tingling and no weakness   Risk factors: not pregnant   Fall Pertinent negatives include no abdominal pain, chills, fever, headaches, nausea, neck pain, numbness, vomiting or weakness.   MAURYA NETHERY is a 42 y.o. female who presents to the ED with low back pain after falling down her wooden steps. She states she took her dog out and he got under her feet and she slipped and fell. Her back hit on the steps. Hx of herniated disc in the lower back.   Past Medical History  Diagnosis Date  . Herniated disc    History reviewed. No pertinent past surgical history. No family history on file. History  Substance Use Topics  . Smoking status: Never Smoker   . Smokeless tobacco: Not on file  . Alcohol Use: No   OB History   Grav Para Term Preterm Abortions TAB SAB Ect Mult Living                 Review of Systems  Constitutional: Negative for fever and chills.  HENT: Negative for neck pain.   Eyes: Negative for visual disturbance.  Respiratory: Negative for shortness of breath.   Gastrointestinal: Negative for nausea, vomiting, abdominal pain and  bowel incontinence.  Genitourinary: Negative for bladder incontinence, dysuria, urgency and frequency.  Musculoskeletal: Positive for back pain.  Skin: Negative for wound.  Neurological: Negative for tingling, syncope, weakness, light-headedness, numbness and headaches.  Psychiatric/Behavioral: The patient is not nervous/anxious.     Allergies  Flexeril and Hydrocodone  Home Medications   Current Outpatient Rx  Name  Route  Sig  Dispense  Refill  . oxyCODONE-acetaminophen (PERCOCET/ROXICET) 5-325 MG per tablet   Oral   Take 1 tablet by mouth every 4 (four) hours as needed for pain.   20 tablet   0    BP 129/71  Pulse 69  Temp(Src) 98.2 F (36.8 C) (Oral)  Resp 18  Ht 5\' 2"  (1.575 m)  Wt 140 lb (63.504 kg)  BMI 25.6 kg/m2  SpO2 100%  LMP 12/18/2012 Physical Exam  Nursing note and vitals reviewed. Constitutional: She is oriented to person, place, and time. She appears well-developed and well-nourished. No distress.  HENT:  Head: Normocephalic and atraumatic.  Eyes: Conjunctivae and EOM are normal. Pupils are equal, round, and reactive to light.  Neck: Normal range of motion. Neck supple.  Cardiovascular: Normal rate, regular rhythm and normal heart sounds.   Pulmonary/Chest: Effort normal and breath sounds normal.  Abdominal: Soft. Bowel sounds are normal. There is no tenderness.  Musculoskeletal:       Lumbar back: She exhibits tenderness. She exhibits normal range of motion, no  swelling, no deformity and no laceration.       Back:  No abrasions, bruising or swelling noted.  Neurological: She is alert and oriented to person, place, and time. She has normal strength and normal reflexes. No cranial nerve deficit or sensory deficit. Gait normal.  Pedal pulses present and equal, adequate circulation, good touch sensation. Straight leg raises without difficulty.   Skin: Skin is warm and dry.  Psychiatric: She has a normal mood and affect. Her behavior is normal.    ED  Course  Procedures (including critical care time) Labs Review Labs Reviewed - No data to display Imaging Review Dg Lumbar Spine Complete  01/12/2013   *RADIOLOGY REPORT*  Clinical Data: Back pain after fall.  Pain is greater on the right.  LUMBAR SPINE - COMPLETE 4+ VIEW  Comparison: MRI lumbar spine 07/18/2011.  Lumbar spine radiographs 09/25/2007.  Findings: 05/02 lumbar type vertebral bodies.  There is straightening of the usual lumbar lordosis but this is stable since previous studies.  No anterior subluxation of the vertebrae.  Facet joints demonstrate normal alignment.  Mild endplate hypertrophic changes at L3-4.  No vertebral compression deformities. Intervertebral disc space heights are preserved.  No focal bone lesion or bone destruction.  Bone cortex and trabecular architecture appear intact.  IMPRESSION: Minimal degenerative changes.  No displaced fractures demonstrated in the lumbar spine.   Original Report Authenticated By: Burman Nieves, M.D.    MDM  42 y.o. female with low back pain s/p fall. Will treat contusion to lower back. Patient states she is allergic to everything except Percocet. When I questioned her further she states that she is not really allergic that nothing helps except for Percocet. I discussed with the patient that we will give muscle relaxant and NSAIDS and she is to follow up with her PCP. Patient stable for discharge home without any immediate complications. Patient ambulatory on discharge without any difficulty. Walking briskly down the hall with steady gait.    Medication List    TAKE these medications       methocarbamol 500 MG tablet  Commonly known as:  ROBAXIN  Take 1 tablet (500 mg total) by mouth 2 (two) times daily.      ASK your doctor about these medications       oxyCODONE-acetaminophen 5-325 MG per tablet  Commonly known as:  PERCOCET/ROXICET  Take 1 tablet by mouth every 4 (four) hours as needed for pain.           Janne Napoleon,  [place] 01/12/13 2249

## 2013-01-12 NOTE — ED Notes (Signed)
Larey Seat when taking my dog out and landed on the lower part of my back on a step per pt.

## 2013-01-12 NOTE — ED Notes (Signed)
Pt c/o lower back pain that began tonight after she slipped and fell onto wooden step. Pt ambulatory on arrival but states pain is worse with movement.

## 2013-01-13 NOTE — ED Notes (Signed)
Pt arrived to ed, with discharge papers in hand stated she was allergic to the prescriptions that she was given. Dr.cook edp on duty was notified of pts request.  Pt notified that she was to follow up with her pmd and take her meds that she was already prescribed on previous ed visit for pain and that she was not allergic to the meds she was prescribed. Pt left angry cursing at staff. Security monitoring pt. As she left

## 2013-02-24 ENCOUNTER — Emergency Department (HOSPITAL_COMMUNITY)
Admission: EM | Admit: 2013-02-24 | Discharge: 2013-02-24 | Disposition: A | Discharge status: Home / Self Care | Payer: Medicaid Other | Attending: Emergency Medicine | Admitting: Emergency Medicine

## 2013-02-24 ENCOUNTER — Encounter (HOSPITAL_COMMUNITY): Payer: Self-pay | Admitting: Emergency Medicine

## 2013-02-24 DIAGNOSIS — N72 Inflammatory disease of cervix uteri: Secondary | ICD-10-CM | POA: Insufficient documentation

## 2013-02-24 DIAGNOSIS — Z8739 Personal history of other diseases of the musculoskeletal system and connective tissue: Secondary | ICD-10-CM | POA: Insufficient documentation

## 2013-02-24 DIAGNOSIS — B9689 Other specified bacterial agents as the cause of diseases classified elsewhere: Secondary | ICD-10-CM | POA: Insufficient documentation

## 2013-02-24 DIAGNOSIS — N898 Other specified noninflammatory disorders of vagina: Secondary | ICD-10-CM | POA: Insufficient documentation

## 2013-02-24 DIAGNOSIS — N949 Unspecified condition associated with female genital organs and menstrual cycle: Secondary | ICD-10-CM | POA: Insufficient documentation

## 2013-02-24 DIAGNOSIS — Z792 Long term (current) use of antibiotics: Secondary | ICD-10-CM | POA: Insufficient documentation

## 2013-02-24 DIAGNOSIS — A499 Bacterial infection, unspecified: Secondary | ICD-10-CM | POA: Insufficient documentation

## 2013-02-24 DIAGNOSIS — Z3202 Encounter for pregnancy test, result negative: Secondary | ICD-10-CM | POA: Insufficient documentation

## 2013-02-24 DIAGNOSIS — N760 Acute vaginitis: Secondary | ICD-10-CM | POA: Insufficient documentation

## 2013-02-24 LAB — URINALYSIS, ROUTINE W REFLEX MICROSCOPIC
Ketones, ur: NEGATIVE mg/dL
Protein, ur: NEGATIVE mg/dL
Urobilinogen, UA: 0.2 mg/dL (ref 0.0–1.0)

## 2013-02-24 LAB — URINE MICROSCOPIC-ADD ON

## 2013-02-24 LAB — PREGNANCY, URINE: Preg Test, Ur: NEGATIVE

## 2013-02-24 MED ORDER — METRONIDAZOLE 500 MG PO TABS: 500.0000 mg | ORAL_TABLET | Freq: Two times a day (BID) | ORAL | Status: DC

## 2013-02-24 MED ORDER — AZITHROMYCIN 250 MG PO TABS
1000.0000 mg | ORAL_TABLET | Freq: Once | ORAL | Status: AC
  Administered 2013-02-24: 1000 mg via ORAL
  Filled 2013-02-24: qty 4

## 2013-02-24 MED ORDER — CEFTRIAXONE SODIUM 250 MG IJ SOLR
250.0000 mg | Freq: Once | INTRAMUSCULAR | Status: AC
  Administered 2013-02-24: 250 mg via INTRAMUSCULAR
  Filled 2013-02-24: qty 250

## 2013-02-24 MED ORDER — LIDOCAINE HCL (PF) 1 % IJ SOLN
INTRAMUSCULAR | Status: AC
  Filled 2013-02-24: qty 5

## 2013-02-24 NOTE — ED Notes (Signed)
Pt c/o mild dysuria, vaginal discharge that "smells like fish" and vaginal pain x 1 hour.

## 2013-02-24 NOTE — ED Provider Notes (Signed)
  Medical screening examination/treatment/procedure(s) were performed by non-physician practitioner and as supervising physician I was immediately available for consultation/collaboration.    Gerhard Munch, MD 02/24/13 2325

## 2013-02-24 NOTE — ED Provider Notes (Signed)
CSN: [id number]     Arrival date & time 02/24/13  2045 History   First MD Initiated Contact with Patient 02/24/13 2102     Chief Complaint  Patient presents with  . Dysuria  . Vaginal Pain   (Consider location/radiation/quality/duration/timing/severity/associated sxs/prior Treatment) HPI Comments: Ann Guzman is a 42 y.o. Female with complaints of pain with urination,  Vaginal pain and discharge which is white and has a fishy odor.  She denies fevers, chills, nausea and vomiting, denies low back or flank pain and denies hematuria.  She is currently sexually active with a new partner and has not been using condoms consistently.  She states her partner is having no symptoms.  She has found no alleviators for her symptoms.     The history is provided by the patient.    Past Medical History  Diagnosis Date  . Herniated disc    History reviewed. No pertinent past surgical history. History reviewed. No pertinent family history. History  Substance Use Topics  . Smoking status: Never Smoker   . Smokeless tobacco: Not on file  . Alcohol Use: No   OB History   Grav Para Term Preterm Abortions TAB SAB Ect Mult Living                 Review of Systems  Constitutional: Negative for fever.  HENT: Negative for congestion and sore throat.   Eyes: Negative.   Respiratory: Negative for chest tightness and shortness of breath.   Cardiovascular: Negative for chest pain.  Gastrointestinal: Negative for nausea, vomiting, abdominal pain and diarrhea.  Genitourinary: Positive for dysuria, vaginal discharge, vaginal pain and pelvic pain. Negative for urgency, frequency and hematuria.  Musculoskeletal: Negative for arthralgias, joint swelling and neck pain.  Skin: Negative.  Negative for rash and wound.  Neurological: Negative for dizziness, weakness, light-headedness, numbness and headaches.  Psychiatric/Behavioral: Negative.     Allergies  Flexeril; Naproxen; and Hydrocodone  Home  Medications   Current Outpatient Rx  Name  Route  Sig  Dispense  Refill  . metroNIDAZOLE (FLAGYL) 500 MG tablet   Oral   Take 1 tablet (500 mg total) by mouth 2 (two) times daily.   14 tablet   0    BP 115/82  Pulse 82  Temp(Src) 97.8 F (36.6 C) (Oral)  Resp 20  Ht 5\' 2"  (1.575 m)  Wt 130 lb (58.968 kg)  BMI 23.77 kg/m2  SpO2 100%  LMP 02/08/2013 Physical Exam  Nursing note and vitals reviewed. Constitutional: She appears well-developed and well-nourished.  HENT:  Head: Normocephalic and atraumatic.  Eyes: Conjunctivae are normal.  Neck: Normal range of motion.  Cardiovascular: Normal rate, regular rhythm, normal heart sounds and intact distal pulses.   Pulmonary/Chest: Effort normal and breath sounds normal. She has no wheezes.  Abdominal: Soft. Bowel sounds are normal. There is no tenderness.  Genitourinary: Uterus is tender. Cervix exhibits motion tenderness and discharge. Right adnexum displays no mass and no tenderness. Left adnexum displays no mass and no tenderness. There is tenderness around the vagina. Vaginal discharge found.  Musculoskeletal: Normal range of motion.  Neurological: She is alert.  Skin: Skin is warm and dry.  Psychiatric: She has a normal mood and affect.    ED Course  Procedures (including critical care time) Labs Review Labs Reviewed  WET PREP, GENITAL - Abnormal; Notable for the following:    Clue Cells Wet Prep HPF POC FEW (*)    WBC, Wet Prep HPF POC MANY (*)  All other components within normal limits  URINALYSIS, ROUTINE W REFLEX MICROSCOPIC - Abnormal; Notable for the following:    Hgb urine dipstick SMALL (*)    Leukocytes, UA SMALL (*)    All other components within normal limits  URINE MICROSCOPIC-ADD ON - Abnormal; Notable for the following:    Squamous Epithelial / LPF MANY (*)    Bacteria, UA MANY (*)    All other components within normal limits  GC/CHLAMYDIA PROBE AMP  PREGNANCY, URINE   Imaging Review No results  found.  EKG Interpretation   None       MDM   1. Cervicitis   2. Bacterial vaginosis    Gc/chlamydia cultures are pending.  Discussed wet prep findings with patient.  She was treated with flagyl for bacterial vaginosis.  Also covered for cervicitis with rocephin 250 IM,  zithromax 1 gram PO. Encouraged her partner to get treated if cultures positive- pt aware they are currently pending.  Encouraged safer sex.  Also recommended she establish care for gyn/yearly pap exams.  Referrals given.    Burgess Amor, PA-C 02/24/13 2244

## 2013-02-24 NOTE — ED Notes (Signed)
Pt alert & oriented x4, stable gait. Patient given discharge instructions, paperwork & prescription(s). Patient  instructed to stop at the registration desk to finish any additional paperwork. Patient verbalized understanding. Pt left department w/ no further questions. 

## 2013-02-26 LAB — GC/CHLAMYDIA PROBE AMP: GC Probe RNA: NEGATIVE

## 2013-06-18 ENCOUNTER — Encounter (HOSPITAL_COMMUNITY): Payer: Self-pay | Admitting: Emergency Medicine

## 2013-06-18 ENCOUNTER — Emergency Department (HOSPITAL_COMMUNITY)
Admission: EM | Admit: 2013-06-18 | Discharge: 2013-06-18 | Disposition: A | Discharge status: Home / Self Care | Payer: Medicaid Other | Attending: Emergency Medicine | Admitting: Emergency Medicine

## 2013-06-18 DIAGNOSIS — R110 Nausea: Secondary | ICD-10-CM | POA: Insufficient documentation

## 2013-06-18 DIAGNOSIS — Z8739 Personal history of other diseases of the musculoskeletal system and connective tissue: Secondary | ICD-10-CM | POA: Insufficient documentation

## 2013-06-18 DIAGNOSIS — Z79899 Other long term (current) drug therapy: Secondary | ICD-10-CM | POA: Insufficient documentation

## 2013-06-18 DIAGNOSIS — Z9851 Tubal ligation status: Secondary | ICD-10-CM | POA: Insufficient documentation

## 2013-06-18 DIAGNOSIS — N390 Urinary tract infection, site not specified: Secondary | ICD-10-CM | POA: Insufficient documentation

## 2013-06-18 LAB — URINALYSIS, ROUTINE W REFLEX MICROSCOPIC
Bilirubin Urine: NEGATIVE
GLUCOSE, UA: NEGATIVE mg/dL
Ketones, ur: NEGATIVE mg/dL
Nitrite: NEGATIVE
PROTEIN: NEGATIVE mg/dL
Specific Gravity, Urine: 1.01 (ref 1.005–1.030)
Urobilinogen, UA: 0.2 mg/dL (ref 0.0–1.0)
pH: 7 (ref 5.0–8.0)

## 2013-06-18 LAB — URINE MICROSCOPIC-ADD ON

## 2013-06-18 MED ORDER — CEPHALEXIN 500 MG PO CAPS
500.0000 mg | ORAL_CAPSULE | Freq: Once | ORAL | Status: AC
  Administered 2013-06-18: 500 mg via ORAL
  Filled 2013-06-18: qty 1

## 2013-06-18 MED ORDER — ONDANSETRON 4 MG PO TBDP
4.0000 mg | ORAL_TABLET | Freq: Once | ORAL | Status: AC
  Administered 2013-06-18: 4 mg via ORAL
  Filled 2013-06-18: qty 1

## 2013-06-18 MED ORDER — PHENAZOPYRIDINE HCL 100 MG PO TABS
200.0000 mg | ORAL_TABLET | Freq: Once | ORAL | Status: AC
  Administered 2013-06-18: 200 mg via ORAL
  Filled 2013-06-18: qty 2

## 2013-06-18 MED ORDER — CEPHALEXIN 500 MG PO CAPS: 500.0000 mg | ORAL_CAPSULE | Freq: Four times a day (QID) | ORAL | Status: DC

## 2013-06-18 MED ORDER — PHENAZOPYRIDINE HCL 200 MG PO TABS: 200.0000 mg | ORAL_TABLET | Freq: Three times a day (TID) | ORAL | Status: DC

## 2013-06-18 NOTE — Discharge Instructions (Signed)
Please increase fluids. [facility] for recheck of your urine in 7 to 10 days. Use medications as suggested until all taken. Urinary Tract Infection Urinary tract infections (UTIs) can develop anywhere along your urinary tract. Your urinary tract is your body's drainage system for removing wastes and extra water. Your urinary tract includes two kidneys, two ureters, a bladder, and a urethra. Your kidneys are a pair of bean-shaped organs. Each kidney is about the size of your fist. They are located below your ribs, one on each side of your spine. CAUSES Infections are caused by microbes, which are microscopic organisms, including fungi, viruses, and bacteria. These organisms are so small that they can only be seen through a microscope. Bacteria are the microbes that most commonly cause UTIs. SYMPTOMS  Symptoms of UTIs may vary by age and gender of the patient and by the location of the infection. Symptoms in young women typically include a frequent and intense urge to urinate and a painful, burning feeling in the bladder or urethra during urination. Older women and men are more likely to be tired, shaky, and weak and have muscle aches and abdominal pain. A fever may mean the infection is in your kidneys. Other symptoms of a kidney infection include pain in your back or sides below the ribs, nausea, and vomiting. DIAGNOSIS To diagnose a UTI, your caregiver will ask you about your symptoms. Your caregiver also will ask to provide a urine sample. The urine sample will be tested for bacteria and white blood cells. White blood cells are made by your body to help fight infection. TREATMENT  Typically, UTIs can be treated with medication. Because most UTIs are caused by a bacterial infection, they usually can be treated with the use of antibiotics. The choice of antibiotic and length of treatment depend on your symptoms and the type of bacteria causing your infection. HOME CARE  INSTRUCTIONS  If you were prescribed antibiotics, take them exactly as your caregiver instructs you. Finish the medication even if you feel better after you have only taken some of the medication.  Drink enough water and fluids to keep your urine clear or pale yellow.  Avoid caffeine, tea, and carbonated beverages. They tend to irritate your bladder.  Empty your bladder often. Avoid holding urine for long periods of time.  Empty your bladder before and after sexual intercourse.  After a bowel movement, women should cleanse from front to back. Use each tissue only once. SEEK MEDICAL CARE IF:   You have back pain.  You develop a fever.  Your symptoms do not begin to resolve within 3 days. SEEK IMMEDIATE MEDICAL CARE IF:   You have severe back pain or lower abdominal pain.  You develop chills.  You have nausea or vomiting.  You have continued burning or discomfort with urination. MAKE SURE YOU:   Understand these instructions.  Will watch your condition.  Will get help right away if you are not doing well or get worse. Document Released: 02/02/2005 Document Revised: 10/25/2011 Document Reviewed: 06/03/2011 [facility] Patient Information 2014 [employer] [place].

## 2013-06-18 NOTE — ED Notes (Addendum)
Pt has had TubalLigation, POCT preg cancelled. EDPa informed, order received.

## 2013-06-18 NOTE — ED Provider Notes (Signed)
CSN: [id number]     Arrival date & time 06/18/13  1105 History   First MD Initiated Contact with Patient 06/18/13 1109     Chief Complaint  Patient presents with  . Urinary Tract Infection     (Consider location/radiation/quality/duration/timing/severity/associated sxs/prior Treatment) Patient is a 43 y.o. female presenting with urinary tract infection. The history is provided by the patient.  Urinary Tract Infection This is a new problem. The current episode started yesterday. The problem occurs constantly. The problem has been unchanged. Associated symptoms include nausea. Pertinent negatives include no abdominal pain, arthralgias, chest pain, coughing, fever or neck pain. Nothing aggravates the symptoms. She has tried nothing for the symptoms. The treatment provided no relief.    Past Medical History  Diagnosis Date  . Herniated disc    Past Surgical History  Procedure Laterality Date  . Tubal ligation     Family History  Problem Relation Age of Onset  . Cancer Mother    History  Substance Use Topics  . Smoking status: Never Smoker   . Smokeless tobacco: Never Used  . Alcohol Use: No   OB History   Grav Para Term Preterm Abortions TAB SAB Ect Mult Living   4 4 4             Review of Systems  Constitutional: Negative for fever and activity change.       All ROS Neg except as noted in HPI  HENT: Negative for nosebleeds.   Eyes: Negative for photophobia and discharge.  Respiratory: Negative for cough, shortness of breath and wheezing.   Cardiovascular: Negative for chest pain and palpitations.  Gastrointestinal: Positive for nausea. Negative for abdominal pain and blood in stool.  Genitourinary: Negative for dysuria, frequency and hematuria.  Musculoskeletal: Negative for arthralgias, back pain and neck pain.  Skin: Negative.   Neurological: Negative for dizziness, seizures and speech difficulty.  Psychiatric/Behavioral: Negative for hallucinations and confusion.       Allergies  Flexeril; Naproxen; Tramadol; and Hydrocodone  Home Medications   Current Outpatient Rx  Name  Route  Sig  Dispense  Refill  . metroNIDAZOLE (FLAGYL) 500 MG tablet   Oral   Take 1 tablet (500 mg total) by mouth 2 (two) times daily.   14 tablet   0    BP 159/94  Pulse 73  Temp(Src) 97.8 F (36.6 C) (Oral)  Resp 16  Ht 5\' 2"  (1.575 m)  Wt 130 lb (58.968 kg)  BMI 23.77 kg/m2  SpO2 100%  LMP 06/07/2013 Physical Exam  Nursing note and vitals reviewed. Constitutional: She is oriented to person, place, and time. She appears well-developed and well-nourished.  Non-toxic appearance.  HENT:  Head: Normocephalic.  Right Ear: Tympanic membrane and external ear normal.  Left Ear: Tympanic membrane and external ear normal.  Eyes: EOM and lids are normal. Pupils are equal, round, and reactive to light.  Neck: Normal range of motion. Neck supple. Carotid bruit is not present.  Cardiovascular: Normal rate, regular rhythm, normal heart sounds, intact distal pulses and normal pulses.   Pulmonary/Chest: Breath sounds normal. No respiratory distress.  Abdominal: Soft. Bowel sounds are normal. There is no guarding.  Mild to mod suprapubic pain.  Musculoskeletal: Normal range of motion.  Lymphadenopathy:       Head (right side): No submandibular adenopathy present.       Head (left side): No submandibular adenopathy present.    She has no cervical adenopathy.  Neurological: She is alert and oriented to  person, place, and time. She has normal strength. No cranial nerve deficit or sensory deficit.  Skin: Skin is warm and dry.  Psychiatric: She has a normal mood and affect. Her speech is normal.    ED Course  Procedures (including critical care time) Labs Review Labs Reviewed  URINALYSIS, ROUTINE W REFLEX MICROSCOPIC   Imaging Review No results found.  EKG Interpretation   None       MDM   Final diagnoses:  None    **I have reviewed nursing notes, vital  signs, and all appropriate lab and imaging results for this patient.* UA reveals a hazy sample with trace Hgb, Small Leukocytes, and 11-20 WBC's. Culture sent to the lab Pt treated with keflex and pyridium. Pt to have office follow up in 7 to 10 days for recheck.   Kathie Dike, PA-C 06/18/13 1246

## 2013-06-18 NOTE — ED Provider Notes (Signed)
Medical screening examination/treatment/procedure(s) were performed by non-physician practitioner and as supervising physician I was immediately available for consultation/collaboration.  EKG Interpretation   None        Gilda Creasehristopher J. Amarys Sliwinski, MD 06/18/13 1316

## 2013-06-18 NOTE — ED Notes (Signed)
Pt c/o pain with urination. Denies blood in urine, nausea or burning.

## 2013-06-19 LAB — URINE CULTURE: Colony Count: 6000

## 2013-07-25 ENCOUNTER — Emergency Department (HOSPITAL_COMMUNITY)
Admission: EM | Admit: 2013-07-25 | Discharge: 2013-07-25 | Disposition: A | Discharge status: Home / Self Care | Payer: MEDICAID | Attending: Emergency Medicine | Admitting: Emergency Medicine

## 2013-07-25 ENCOUNTER — Encounter (HOSPITAL_COMMUNITY): Payer: Self-pay | Admitting: *Deleted

## 2013-07-25 DIAGNOSIS — R1110 Vomiting, unspecified: Secondary | ICD-10-CM | POA: Insufficient documentation

## 2013-07-25 DIAGNOSIS — F329 Major depressive disorder, single episode, unspecified: Secondary | ICD-10-CM | POA: Insufficient documentation

## 2013-07-25 DIAGNOSIS — Z3202 Encounter for pregnancy test, result negative: Secondary | ICD-10-CM | POA: Insufficient documentation

## 2013-07-25 DIAGNOSIS — R45851 Suicidal ideations: Secondary | ICD-10-CM | POA: Insufficient documentation

## 2013-07-25 DIAGNOSIS — F3289 Other specified depressive episodes: Secondary | ICD-10-CM | POA: Insufficient documentation

## 2013-07-25 DIAGNOSIS — Z8739 Personal history of other diseases of the musculoskeletal system and connective tissue: Secondary | ICD-10-CM | POA: Insufficient documentation

## 2013-07-25 DIAGNOSIS — F1910 Other psychoactive substance abuse, uncomplicated: Secondary | ICD-10-CM | POA: Insufficient documentation

## 2013-07-25 DIAGNOSIS — R197 Diarrhea, unspecified: Secondary | ICD-10-CM | POA: Insufficient documentation

## 2013-07-25 HISTORY — DX: Major depressive disorder, single episode, unspecified: F32.9

## 2013-07-25 HISTORY — DX: Anxiety disorder, unspecified: F41.9

## 2013-07-25 HISTORY — DX: Depression, unspecified: F32.A

## 2013-07-25 LAB — COMPREHENSIVE METABOLIC PANEL
ALT: 14 U/L (ref 0–35)
AST: 13 U/L (ref 0–37)
Albumin: 4.2 g/dL (ref 3.5–5.2)
Alkaline Phosphatase: 46 U/L (ref 39–117)
BUN: 10 mg/dL (ref 6–23)
CALCIUM: 9.5 mg/dL (ref 8.4–10.5)
CHLORIDE: 101 meq/L (ref 96–112)
CO2: 26 meq/L (ref 19–32)
CREATININE: 0.75 mg/dL (ref 0.50–1.10)
GLUCOSE: 124 mg/dL — AB (ref 70–99)
Potassium: 3.7 mEq/L (ref 3.7–5.3)
Sodium: 143 mEq/L (ref 137–147)
Total Bilirubin: 0.5 mg/dL (ref 0.3–1.2)
Total Protein: 8.1 g/dL (ref 6.0–8.3)

## 2013-07-25 LAB — RAPID URINE DRUG SCREEN, HOSP PERFORMED
Amphetamines: NOT DETECTED
Barbiturates: NOT DETECTED
Benzodiazepines: NOT DETECTED
COCAINE: NOT DETECTED
Opiates: NOT DETECTED
Tetrahydrocannabinol: NOT DETECTED

## 2013-07-25 LAB — CBC
HEMATOCRIT: 41.8 % (ref 36.0–46.0)
HEMOGLOBIN: 14.2 g/dL (ref 12.0–15.0)
MCH: 29.8 pg (ref 26.0–34.0)
MCHC: 34 g/dL (ref 30.0–36.0)
MCV: 87.8 fL (ref 78.0–100.0)
PLATELETS: 366 10*3/uL (ref 150–400)
RBC: 4.76 MIL/uL (ref 3.87–5.11)
RDW: 14 % (ref 11.5–15.5)
WBC: 8.2 10*3/uL (ref 4.0–10.5)

## 2013-07-25 LAB — ACETAMINOPHEN LEVEL: Acetaminophen (Tylenol), Serum: <15 ug/mL (ref 10–30)

## 2013-07-25 LAB — POC URINE PREG, ED: PREG TEST UR: NEGATIVE

## 2013-07-25 LAB — ETHANOL: Alcohol, Ethyl (B): 159 mg/dL — ABNORMAL HIGH (ref 0–11)

## 2013-07-25 LAB — SALICYLATE LEVEL

## 2013-07-25 MED ORDER — LORAZEPAM 1 MG PO TABS
1.0000 mg | ORAL_TABLET | Freq: Once | ORAL | Status: AC
  Administered 2013-07-25: 1 mg via ORAL
  Filled 2013-07-25: qty 1

## 2013-07-25 MED ORDER — LORAZEPAM 2 MG/ML IJ SOLN: 1.0000 mg | Freq: Four times a day (QID) | INTRAMUSCULAR | Status: DC | PRN

## 2013-07-25 MED ORDER — ALUM & MAG HYDROXIDE-SIMETH 200-200-20 MG/5ML PO SUSP: 30.0000 mL | ORAL | Status: DC | PRN

## 2013-07-25 MED ORDER — ACETAMINOPHEN 325 MG PO TABS: 650.0000 mg | ORAL_TABLET | ORAL | Status: DC | PRN

## 2013-07-25 MED ORDER — LORAZEPAM 1 MG PO TABS
1.0000 mg | ORAL_TABLET | Freq: Three times a day (TID) | ORAL | Status: DC | PRN
  Filled 2013-07-25: qty 1

## 2013-07-25 MED ORDER — VITAMIN B-1 100 MG PO TABS
100.0000 mg | ORAL_TABLET | Freq: Every day | ORAL | Status: DC
  Administered 2013-07-25: 100 mg via ORAL
  Filled 2013-07-25: qty 1

## 2013-07-25 MED ORDER — ADULT MULTIVITAMIN W/MINERALS CH
1.0000 | ORAL_TABLET | Freq: Every day | ORAL | Status: DC
  Administered 2013-07-25: 1 via ORAL
  Filled 2013-07-25: qty 1

## 2013-07-25 MED ORDER — LORAZEPAM 1 MG PO TABS
1.0000 mg | ORAL_TABLET | Freq: Four times a day (QID) | ORAL | Status: DC | PRN
  Administered 2013-07-25: 1 mg via ORAL

## 2013-07-25 MED ORDER — ONDANSETRON HCL 4 MG PO TABS
4.0000 mg | ORAL_TABLET | Freq: Three times a day (TID) | ORAL | Status: DC | PRN
  Administered 2013-07-25: 4 mg via ORAL
  Filled 2013-07-25: qty 1

## 2013-07-25 MED ORDER — ZOLPIDEM TARTRATE 5 MG PO TABS: 5.0000 mg | ORAL_TABLET | Freq: Every evening | ORAL | Status: DC | PRN

## 2013-07-25 MED ORDER — THIAMINE HCL 100 MG/ML IJ SOLN: 100.0000 mg | Freq: Every day | INTRAMUSCULAR | Status: DC

## 2013-07-25 MED ORDER — LORAZEPAM 1 MG PO TABS
0.0000 mg | ORAL_TABLET | Freq: Four times a day (QID) | ORAL | Status: DC
  Administered 2013-07-25: 1 mg via ORAL
  Administered 2013-07-25: 2 mg via ORAL
  Filled 2013-07-25: qty 1
  Filled 2013-07-25: qty 2

## 2013-07-25 MED ORDER — NICOTINE 21 MG/24HR TD PT24
21.0000 mg | MEDICATED_PATCH | Freq: Every day | TRANSDERMAL | Status: DC
  Filled 2013-07-25: qty 1

## 2013-07-25 MED ORDER — FOLIC ACID 1 MG PO TABS
1.0000 mg | ORAL_TABLET | Freq: Every day | ORAL | Status: DC
  Administered 2013-07-25: 1 mg via ORAL
  Filled 2013-07-25: qty 1

## 2013-07-25 MED ORDER — LORAZEPAM 1 MG PO TABS: 0.0000 mg | ORAL_TABLET | Freq: Two times a day (BID) | ORAL | Status: DC

## 2013-07-25 NOTE — Progress Notes (Signed)
CSW consult to pt regarding services (ACTT, CST) in the community after discharge. CSW explained services to pt and pt is agreeable to pursue. [facility] spoke with [facility] [place] and [place]. CSW asked [facility] if they are able to come and assess pt while in ED because pt does not have a telephon. [facility] will contact CSW to determine if they are able to see pt. Pt made aware.   [address]Ann Guzman, [place] [phone]

## 2013-07-25 NOTE — ED Notes (Signed)
Pt states her bf called and said her mother died last night from a heart attack and the wake is tonight. Pt states she just wants to leave and does not have any thoughts of harming herself or anyone.  Notified MD of this. Had tele psych MD paged for Dr. Silverio LayYao. MD spoke with pt as pt was attempting to walk out. Dr. Silverio LayYao assessed pt and has decided to discharge pt. Pt is back in room and willing to wait for discharge papers.

## 2013-07-25 NOTE — Discharge Instructions (Signed)
Stop using drugs and alcohol.   Follow up with your doctor.   Return to ER if you have withdrawal symptoms, thoughts of harming yourself or others.    Emergency Department Resource Guide 1) Find a Doctor and Pay Out of Pocket Although you won't have to find out who is covered by your insurance plan, it is a good idea to ask around and get recommendations. You will then need to call the office and see if the doctor you have chosen will accept you as a new patient and what types of options they offer for patients who are self-pay. Some doctors offer discounts or will set up payment plans for their patients who do not have insurance, but you will need to ask so you aren't surprised when you get to your appointment.  2) Contact Your Local Health Department Not all health departments have doctors that can see patients for sick visits, but many do, so it is worth a call to see if yours does. If you don't know where your local health department is, you can check in your phone book. The CDC also has a tool to help you locate your state's health department, and many state websites also have listings of all of their local health departments.  3) Find a Walk-in Clinic If your illness is not likely to be very severe or complicated, you may want to try a walk in clinic. These are popping up all over the country in pharmacies, drugstores, and shopping centers. They're usually staffed by nurse practitioners or physician assistants that have been trained to treat common illnesses and complaints. They're usually fairly quick and inexpensive. However, if you have serious medical issues or chronic medical problems, these are probably not your best option.  No Primary Care Doctor: - Call Health Connect at  [phone] - they can help you locate a primary care doctor that  accepts your insurance, provides certain services, etc. - Physician Referral Service- [phone]  Chronic Pain Problems: Organization          Address  Phone   Notes  [facility]  [phone] Patients need to be referred by their primary care doctor.   Medication Assistance: Organization         Address  Phone   Notes  [facility]ication [facility] [address] [place], [place] [address] [phone] --Must be a resident of [place] -- Must have NO insurance coverage whatsoever (no Medicaid/ Medicare, etc.) -- The pt. MUST have a primary care doctor that directs their care regularly and follows them in the community   MedAssist  [phone]   [employer]  [phone]    Agencies that provide inexpensive medical care: Organization         Address  Phone   Notes  [facility]  [phone]   [facility]    [phone]   [facility] [address] [place], [place] [address] [phone]   [facility] [address], [place] [phone]   Planned Parenthood    [phone]   [facility]    [phone]   [facility]  [address], [place] Phone:  [phone], Fax:  [phone] Hours of Operation:  9 am - 6 pm, M-F.  Also accepts Medicaid/Medicare and self-pay.  [facility]  [address]  [place], [place], [place] Phone: [phone], Fax: [phone]. Hours of Operation:  8:30 am - 5:30 pm, M-F.  Also accepts Medicaid and self-pay.  [facility] [place] [address] [place] Phone: [phone]   [place], [place], [place] [phone], Ext. [phone] Mondays & Thursdays: 7-9 AM.  First 15 patients are seen on a first come, first serve basis.    [place] Providers:  Organization         Address  Phone   Notes  [facility] [address], [place] [phone] Also accepts self-pay patients.  [facility] [address], [place]  [phone]   [place] [place], [address], [place] [phone]   [facility] [address] [place] [phone]   Lucianne Lei [address], [place]   [phone] Only accepts [place] Access [place] patients after they have their name applied to their card.   Self-Pay (no insurance) in [facility]:  Organization         Address  Phone   Notes  Sickle Cell Patients, [facility] [place] [phone]   [facility] [place] [phone]   [facility]  [place], [place], [place] [phone]   [facility]/Dr. Osei-Bonsu  [address] [place] or [address], [place] [phone] Phone number for both [place] and [place] locations is the same.  [facility]gent Medical [facility] [address], [place] [phone]   [facility] [address] [place] or [address] [phone]   [facility] [address] [place] [phone], phone; [phone], fax Sees patients 1st and 3rd Saturday of every month.  Must not qualify for public or private insurance (i.e. Medicaid, Medicare, [place] Health Choice, Veterans' Benefits)  Household income should be no more than 200% of the poverty level The clinic cannot treat you if you are pregnant or think you are pregnant  Sexually transmitted diseases are not treated at the clinic.    Dental Care: Organization         Address  Phone  Notes  [facility] [place] [phone] Accepts children up to age 60 who are enrolled in [place] or [place]; pregnant women with a Medicaid card; and  children who have applied for Medicaid or [place] Health Choice, but were declined, whose parents can pay a reduced fee at time of service.  [facility]  [address], [place] [phone] Accepts children up to age 63 who are enrolled in [place] or [place]; pregnant women with a Medicaid card; and children who have applied for Medicaid or  Health Choice, but were declined, whose parents can pay a reduced fee at time of service.  [facility] Access PROGRAM  [place] [phone] Patients are seen by appointment only. Walk-ins are not accepted. Mutual will see patients 43 years of age and older. Monday - Tuesday (8am-5pm) Most Wednesdays (8:30-5pm) $30 per visit, cash only  [place]  [address], [place] [phone] Patients are seen by appointment only. Walk-ins are not accepted. [place] will see patients 31 years of age and older. One Wednesday Evening (Monthly: Volunteer Based).  $30 per visit, cash only  [place]  [phone] for adults; Children under age 53, call Graduate Pediatric Dentistry at [phone]. Children aged 35-14, please call [phone] to request a pediatric application.  Dental services are provided in all areas of dental care including fillings, crowns and bridges, complete and partial dentures, implants, gum treatment, root canals, and extractions. Preventive care is also provided. Treatment is provided to both adults and children. Patients are selected via a lottery and there is often a waiting list.   [facility] [address] [place]  [phone] [email or web address]   [facility] [address] [place], [place] [phone], Ext. [phone] Second and Fourth Thursday of each month, opens at 6:30 AM; Clinic ends at 9 AM.  Patients are seen on a first-come first-served  basis, and a limited number are seen during each clinic.   [facility]  [address] [place]ard Danker [place], [place] [phone]   Eligibility Requirements You must have lived in [place], [place], or [place] counties for at least the last three months.   You cannot be eligible for state or federal sponsored [employer], including [employer], [place], or [employer].   You generally cannot be eligible for healthcare insurance through your employer.    How to apply: Eligibility screenings are held every Tuesday and Wednesday afternoon from 1:00 pm until 4:00 pm. You do not need an appointment for the interview!  [facility] [address], [place], [place]   [place]  [place] Department  [place]  [phone]    Behavioral Health Resources in the Community: Intensive Outpatient Programs Organization         Address  Phone  Notes  [place] [address], [place], [place] [phone]   [facility] Outpatient [address] [place], [place]   ADS: Alcohol & Drug Svcs [address], [place], [place]   [place] [address],  [place], [place] or [phone]   Substance Abuse Resources Organization         Address  Phone  Notes  Alcohol and Drug Services  [phone]   [place]  [phone]   The [place]   [place]  [phone]   Residential & Outpatient Substance Abuse Program  [phone]   Psychological Services Organization         Address  Phone  Notes  [facility] [place]  [place]  [phone]   [place] [address], [place] or [phone]    Mobile Crisis Teams Organization          Address  Phone  Notes  [facility], Mobile Crisis Care Unit  [phone]   [facility]  [address] [place], [place]   [place] Levels [address], [place] [phone]    Self-Help/Support Groups Organization         Address  Phone             Notes  [place] of [place] -  variety of support groups  [phone] Call for more information  Narcotics Anonymous (NA), [facility] [address], [place]  2 meetings at this location   Residential Facilities manager         Address  Phone  Notes  [facility]AP Residential Treatment [place],    [place]  [phone]   [facility]  [address] [place] [phone], [place], [place]   [place], [place] [phone] Admissions: 8am-3pm M-F  Incentives Substance [place] [address]    [place], [place] [phone]   [facility] [address] [place], [place], [place]   [facility] [address]  [place], [place]   [facility] Programs - Intensive Outpatient [address], [place], [place]   [facility].,  [place], [place] [phone] or [phone]   [facility] (RTS) [address], [place], [place] Accepts Medicaid  [facility] [place] [address],  [place] [phone] Substance Abuse/Addiction Treatment   [facility] Organization         Address  Phone  Notes  [facility]  [phone]   Domenic Schwab, PhD [address] Arlis [place] [place], [place]   [phone] or [phone]   [place], [place] [phone]   [facility] [address] [place], [place] [phone] Insurance/Medicaid/sponsorship  through [facility] and Families [address] [place]                                    [place], [place] [phone] [place] [address] [place], [place] [phone]    Dr. Adele Schilder  [phone]   [facility] Dept. 1) [address]  2) [place] 3)  [place] [address], [place] [phone]  [phone]   [place] [phone] or [phone] (After Hours)

## 2013-07-25 NOTE — BH Assessment (Signed)
Tele Assessment Note   Ann Guzman is a 43 y.o. female  Who presents to [facility] for alcohol and opiate detox.  Pt denies SI/HI/AVH.  Pt reports the following: states she was prescribed pain pills for issues with disc and has become addicted to opana, percocet and and oxycodone, consuming 15 pills a day.  Pt states last use was 07/24/13, however UDS is negative.  Pt also drinking 2-1/5's daily, last drink was 07/24/13.  Pt drank 1.5-1/5's on 07/25/13.  Pt c/o w/d sxs: leg cramps, runny nose, diarrhea, tremors, "skin crawling", sweats and cold chills. Pt reports stressors: family.    Axis I: Alcohol use disorder, Severe; Opioid use disorder, Severe Axis II: Deferred Axis III:  Past Medical History  Diagnosis Date  . Herniated disc   . Depression   . Anxiety    Axis IV: other psychosocial or environmental problems, problems related to social environment and problems with primary support group Axis V: 51-60 moderate symptoms  Past Medical History:  Past Medical History  Diagnosis Date  . Herniated disc   . Depression   . Anxiety     Past Surgical History  Procedure Laterality Date  . Tubal ligation      Family History:  Family History  Problem Relation Age of Onset  . Cancer Mother     Social History:  reports that she has never smoked. She has never used smokeless tobacco. She reports that she uses illicit drugs (Oxycodone). She reports that she does not drink alcohol.  Additional Social History:  Alcohol / Drug Use Pain Medications: See MAR  Prescriptions: See MAR  Over the Counter: See MAR  History of alcohol / drug use?: Yes Longest period of sobriety (when/how long): None  Negative Consequences of Use: Work / School;Personal relationships;Financial Withdrawal Symptoms: Cramps;Diarrhea;Sweats;Tremors;Other (Comment) (Runny nose, 'Skin Crawling", ) Substance #1 Name of Substance 1: Alcohol  1 - Age of First Use: Teens  1 - Amount (size/oz): 2-1/5's 1 - Frequency:  Daily  1 - Duration: On-going  1 - Last Use / Amount: 07/25/13 Substance #2 Name of Substance 2: Opiates--percocet, opana, oxycodone  2 - Age of First Use: 30's  2 - Amount (size/oz): 15 Pills  2 - Frequency: Daily  2 - Duration: On-going  2 - Last Use / Amount: Pt states last use was 07/24/13, however UDS is negative  CIWA: CIWA-Ar BP: 117/90 mmHg Pulse Rate: 112 Nausea and Vomiting: no nausea and no vomiting Tactile Disturbances: moderate itching, pins and needles, burning or numbness Tremor: no tremor Auditory Disturbances: not present Paroxysmal Sweats: barely perceptible sweating, palms moist Visual Disturbances: not present Anxiety: mildly anxious Headache, Fullness in Head: severe Agitation: normal activity Orientation and Clouding of Sensorium: oriented and can do serial additions CIWA-Ar Total: 10 COWS: Clinical Opiate Withdrawal Scale (COWS) Resting Pulse Rate: Pulse Rate 101-120 Sweating: No report of chills or flushing Restlessness: Able to sit still Pupil Size: Pupils pinned or normal size for room light Bone or Joint Aches: Patient reports sever diffuse aching of joints/muscles Runny Nose or Tearing: Not present GI Upset: nausea or loose stool Tremor: No tremor Yawning: No yawning Anxiety or Irritability: Patient reports increasing irritability or anxiousness Gooseflesh Skin: Skin is smooth COWS Total Score: 7  Allergies:  Allergies  Allergen Reactions  . Flexeril [Cyclobenzaprine] Nausea And Vomiting  . Naproxen   . Tramadol Hives  . Hydrocodone Itching and Rash    Home Medications:  (Not in a hospital admission)  OB/GYN Status:  Patient's last menstrual period was 06/09/2013.  General Assessment Data [facility] of Assessment: [facility] ED Is this a Tele or Face-to-Face Assessment?: Tele Assessment Is this an Initial Assessment or a Re-assessment for this encounter?: Initial Assessment Living Arrangements: Alone Can pt return to current living  arrangement?: Yes Admission Status: Voluntary Is patient capable of signing voluntary admission?: Yes Transfer from: Acute Hospital Referral Source: MD  Medical Screening Exam [facility] Walk-in ONLY) Medical Exam completed: No Reason for MSE not completed: Other: (None )  [facility] Crisis Care Plan Living Arrangements: Alone Name of Psychiatrist: None  Name of Therapist: None   Education Status Is patient currently in school?: No Current Grade: None  Highest grade of school patient has completed: None  Name of school: None  Contact person: None   Risk to self Suicidal Ideation: No Suicidal Intent: No Is patient at risk for suicide?: No Suicidal Plan?: No Access to Means: No What has been your use of drugs/alcohol within the last 12 months?: Abusing: opiates and alcohol  Previous Attempts/Gestures: No How many times?: 0 Other Self Harm Risks: None  Triggers for Past Attempts: None known Intentional Self Injurious Behavior: None Family Suicide History: No Recent stressful life event(s): Other (Comment);Conflict (Comment) (Family issues ) Persecutory voices/beliefs?: No Depression: Yes Depression Symptoms: Loss of interest in usual pleasures Substance abuse history and/or treatment for substance abuse?: No Suicide prevention information given to non-admitted patients: Not applicable  Risk to Others Homicidal Ideation: No Thoughts of Harm to Others: No Current Homicidal Intent: No Current Homicidal Plan: No Access to Homicidal Means: No Identified Victim: None  History of harm to others?: No Assessment of Violence: None Noted Violent Behavior Description: None  Does patient have access to weapons?: No Criminal Charges Pending?: No Does patient have a court date: No  Psychosis Hallucinations: None noted Delusions: None noted  Mental Status Report Appear/Hygiene: Disheveled Eye Contact: Good Motor Activity: Unremarkable Speech: Logical/coherent Level of Consciousness:  Alert Mood: Depressed Affect: Depressed Anxiety Level: None Thought Processes: Coherent;Relevant Judgement: Unimpaired Orientation: Person;Place;Time;Situation Obsessive Compulsive Thoughts/Behaviors: None  Cognitive Functioning Concentration: Normal Memory: Recent Intact;Remote Intact IQ: Average Insight: Good Impulse Control: Good Weight Loss: 0 Weight Gain: 0 Sleep: Decreased Total Hours of Sleep: 6 Vegetative Symptoms: None  ADLScreening [facility] Assessment Services) Patient's cognitive ability adequate to safely complete daily activities?: Yes Patient able to express need for assistance with ADLs?: Yes Independently performs ADLs?: Yes (appropriate for developmental age)  Prior Inpatient Therapy Prior Inpatient Therapy: No Prior Therapy Dates: None  Prior Therapy Facilty/Provider(s): None  Reason for Treatment: None   Prior Outpatient Therapy Prior Outpatient Therapy: No Prior Therapy Dates: None  Prior Therapy Facilty/Provider(s): None  Reason for Treatment: None   ADL Screening (condition at time of admission) Patient's cognitive ability adequate to safely complete daily activities?: Yes Is the patient deaf or have difficulty hearing?: No Does the patient have difficulty seeing, even when wearing glasses/contacts?: No Does the patient have difficulty concentrating, remembering, or making decisions?: No Patient able to express need for assistance with ADLs?: Yes Does the patient have difficulty dressing or bathing?: No Independently performs ADLs?: Yes (appropriate for developmental age) Does the patient have difficulty walking or climbing stairs?: No Weakness of Legs: None Weakness of Arms/Hands: None  Home Assistive Devices/Equipment Home Assistive Devices/Equipment: None  Therapy Consults (therapy consults require a physician order) PT Evaluation Needed: No OT Evalulation Needed: No SLP Evaluation Needed: No Abuse/Neglect Assessment (Assessment to be  complete while patient is alone) Physical  Abuse: Denies Verbal Abuse: Denies Sexual Abuse: Denies Exploitation of patient/patient's resources: Denies Self-Neglect: Denies Values / Beliefs Cultural Requests During Hospitalization: None Spiritual Requests During Hospitalization: None Consults Spiritual Care Consult Needed: No Social Work Consult Needed: No Merchant navy officerAdvance Directives (For Healthcare) Advance Directive: Patient does not have advance directive;Patient would not like information Pre-existing out of facility DNR order (yellow form or pink MOST form): No Nutrition Screen- [facility] Adult/WL/AP Patient's home diet: Regular  Additional Information 1:1 In Past 12 Months?: No CIRT Risk: No Elopement Risk: No Does patient have medical clearance?: Yes     Disposition:  Disposition Initial Assessment Completed for this Encounter: Yes Disposition of Patient: Inpatient treatment program;Ref[facility] to ([facility] ) Type of inpatient treatment program: Adult Patient referred to: Other (Comment) (Pending possible placement with [facility] )  Beatrix ShipperSimmons, Nicholson Starace C 07/25/2013 9:05 AM

## 2013-07-25 NOTE — ED Notes (Signed)
Pt educated about side effects of polysubstance abuse and the need to be hospitalized when coming off of them. Pt verbalized her understanding. Pt has friend that will pick her up.

## 2013-07-25 NOTE — ED Notes (Addendum)
Withdrew from narcotics - opiods. It has been 3 days. Pt. Having shakes, vomiting and diarrhea. Would cut wrist if she was to follow thru with SI.

## 2013-07-25 NOTE — ED Provider Notes (Signed)
CSN: [id number]     Arrival date & time 07/25/13  0032 History   First MD Initiated Contact with Patient 07/25/13 0146     Chief Complaint  Patient presents with  . Drug Problem  . Suicidal     (Consider location/radiation/quality/duration/timing/severity/associated sxs/prior Treatment) HPI Comments: Patient is a 43 y/o female who presents to the emergency department today for assistance with placement in a rehabilitation facility for narcotic and alcohol abuse. Patient states that she has been snorting opioids for approximately 15 years. Patient endorses a period of sobriety for 5 years, but states she has not been sober since the age of 18. Patient states she last drank alcohol and snorting opioids one hour prior to arrival. She states she used morphine, Opana, and oxycodone. She also drinks a fifth or two fifths of vodka daily. She denies a hx of seizures from ETOH withdrawal. Patient states she sometimes has thoughts of hurting herself. This is mostly motivated by depression and she endorses a hx of bipolar for which she is "on Xanax" only. She denies using with the intent to bring harm to herself. She denies suicidal plan to me; nursing note states patient contemplating slitting her wrists. She denies HI.   Patient is a 43 y.o. female presenting with drug problem. The history is provided by the patient. No language interpreter was used.  Drug Problem Associated symptoms include vomiting.    Past Medical History  Diagnosis Date  . Herniated disc    Past Surgical History  Procedure Laterality Date  . Tubal ligation     Family History  Problem Relation Age of Onset  . Cancer Mother    History  Substance Use Topics  . Smoking status: Never Smoker   . Smokeless tobacco: Never Used  . Alcohol Use: No   OB History   Grav Para Term Preterm Abortions TAB SAB Ect Mult Living   4 4 4             Review of Systems  Gastrointestinal: Positive for vomiting and diarrhea.   Neurological: Negative for seizures.  Psychiatric/Behavioral: Positive for suicidal ideas and behavioral problems.  All other systems reviewed and are negative.      Allergies  Flexeril; Naproxen; Tramadol; and Hydrocodone  Home Medications   Current Outpatient Rx  Name  Route  Sig  Dispense  Refill  . OxyCODONE HCl (OXYCONTIN PO)   Oral   Take 80 mg by mouth 3 (three) times daily as needed (pain).         Marland Kitchen oxyCODONE-acetaminophen (PERCOCET/ROXICET) 5-325 MG per tablet   Oral   Take 2 tablets by mouth every 8 (eight) hours as needed for moderate pain or severe pain (pain).          BP 98/74  Pulse 83  Temp(Src) 97.5 F (36.4 C) (Oral)  Resp 16  SpO2 96%  LMP 06/09/2013  Physical Exam  Nursing note and vitals reviewed. Constitutional: She is oriented to person, place, and time. She appears well-developed and well-nourished. No distress.  HENT:  Head: Normocephalic and atraumatic.  Eyes: Conjunctivae and EOM are normal. No scleral icterus.  Neck: Normal range of motion.  Cardiovascular: Normal rate, regular rhythm and intact distal pulses.   Pulmonary/Chest: Effort normal. No respiratory distress.  Musculoskeletal: Normal range of motion.  Neurological: She is alert and oriented to person, place, and time.  GCS 15. Speech is goal oriented. Patient moves extremities without ataxia.  Skin: Skin is warm and dry. No rash  noted. She is not diaphoretic. No erythema. No pallor.  Psychiatric: Her speech is delayed. She is withdrawn. Cognition and memory are normal. She exhibits a depressed mood. She expresses suicidal ideation. She expresses no homicidal ideation. She expresses no suicidal plans and no homicidal plans.    ED Course  Procedures (including critical care time) Labs Review Labs Reviewed  COMPREHENSIVE METABOLIC PANEL - Abnormal; Notable for the following:    Glucose, Bld 124 (*)    All other components within normal limits  ETHANOL - Abnormal; Notable  for the following:    Alcohol, Ethyl (B) 159 (*)    All other components within normal limits  CBC  URINE RAPID DRUG SCREEN (HOSP PERFORMED)  ACETAMINOPHEN LEVEL  SALICYLATE LEVEL  POC URINE PREG, ED   Imaging Review No results found.   EKG Interpretation None      MDM   Final diagnoses:  Polysubstance abuse    43 year old female presents for assistance with placement in a rehabilitation facility for drug and alcohol abuse. Patient also endorsing suicidal thoughts without active plan. Of note, patient arrived to nurse a plan to slit her wrists while she stated to me that she had no active suicidal plan. Patient states she last used opioids and alcohol one hour prior to arrival. Patient is well and nontoxic appearing, hemodynamically stable, and afebrile. Physical exam is unremarkable today. Labs significant for ethanol level of 159. Patient otherwise medically cleared and pending TTS eval.  Patient disposition to be determined by oncoming ED provider.    Antony MaduraKelly Braelin Costlow, PA-C 07/25/13 [phone]

## 2013-07-25 NOTE — ED Notes (Signed)
Pt ambulated to restroom with slow, steady gait under supervision of this RN. Pt unable to void. Will try again shortly.

## 2013-07-25 NOTE — ED Notes (Signed)
Pt set up for tele psych eval- computer in room.

## 2013-07-25 NOTE — ED Notes (Signed)
Pt was given all belongings back.

## 2013-07-25 NOTE — ED Provider Notes (Signed)
Patient doesn't want detox now. Says that she is not suicidal. She wanted to go home to attend mother's funeral. TTS evaluated this AM, was planning to admit for detox. But she refused. She is voluntary and doesn't need IVC. Will give outpatient resources.   Richardean Canalavid H Yao, MD 07/25/13 [phone]

## 2013-07-25 NOTE — ED Provider Notes (Signed)
Medical screening examination/treatment/procedure(s) were performed by non-physician practitioner and as supervising physician I was immediately available for consultation/collaboration.   Jeidi Gilles, MD 07/25/13 0527 

## 2013-08-29 ENCOUNTER — Ambulatory Visit (HOSPITAL_COMMUNITY): Payer: Medicaid Other

## 2013-08-29 ENCOUNTER — Encounter (HOSPITAL_COMMUNITY): Payer: Self-pay | Admitting: Emergency Medicine

## 2013-08-29 ENCOUNTER — Emergency Department (HOSPITAL_COMMUNITY)
Admission: EM | Admit: 2013-08-29 | Discharge: 2013-08-29 | Discharge status: Against Medical Advice | Payer: Medicaid Other | Attending: Emergency Medicine | Admitting: Emergency Medicine

## 2013-08-29 DIAGNOSIS — Z8659 Personal history of other mental and behavioral disorders: Secondary | ICD-10-CM | POA: Insufficient documentation

## 2013-08-29 DIAGNOSIS — M549 Dorsalgia, unspecified: Secondary | ICD-10-CM | POA: Insufficient documentation

## 2013-08-29 MED ORDER — ONDANSETRON 4 MG PO TBDP
8.0000 mg | ORAL_TABLET | Freq: Once | ORAL | Status: AC
  Administered 2013-08-29: 8 mg via ORAL
  Filled 2013-08-29: qty 2

## 2013-08-29 MED ORDER — OXYCODONE-ACETAMINOPHEN 5-325 MG PO TABS
2.0000 | ORAL_TABLET | Freq: Once | ORAL | Status: AC
  Administered 2013-08-29: 2 via ORAL
  Filled 2013-08-29: qty 2

## 2013-08-29 NOTE — ED Provider Notes (Signed)
CSN: [id number]     Arrival date & time 08/29/13  0927 History   This chart was scribed for non-physician practitioner working with Junious SilkHannah Darrian Grzelak, by Tana ConchStephen Methvin ED Scribe. This patient was seen in TR11C/TR11C and the patient's care was started at 11:25 AM.     Chief Complaint  Patient presents with  . Back Pain      The history is provided by the patient. No language interpreter was used.    HPI Comments: Ann Guzman is a 43 y.o. female with h/o herniated disk who presents to the Emergency Department complaining of worsening, sharp back pain that began when she was moving a couch this morning and felt a "pop". The pain is rated a 10/10. She states that the pain does not radiate down her leg and stays in her low spine. She states that her back hurts when she is standing, moving, and lying down. She cannot find a position that makes the pain better. She has not taken any pain medication. She denies bowel or bladder incontinence, fevers, chills, IVDA, hx of cancer.   Past Medical History  Diagnosis Date  . Herniated disc   . Depression   . Anxiety    Past Surgical History  Procedure Laterality Date  . Tubal ligation     Family History  Problem Relation Age of Onset  . Cancer Mother    History  Substance Use Topics  . Smoking status: Never Smoker   . Smokeless tobacco: Never Used  . Alcohol Use: No     Comment: 2-1/5's daily    OB History   Grav Para Term Preterm Abortions TAB SAB Ect Mult Living   4 4 4             Review of Systems  Constitutional: Positive for activity change.  Musculoskeletal: Positive for back pain.  All other systems reviewed and are negative.     Allergies  Flexeril; Naproxen; Tramadol; and Hydrocodone  Home Medications   Prior to Admission medications   Not on File   BP 150/99  Pulse 89  Temp(Src) 97.3 F (36.3 C) (Oral)  Resp 20  SpO2 100%  LMP 03/31/2013 Physical Exam  Nursing note and vitals reviewed. Constitutional:  She is oriented to person, place, and time. She appears well-developed and well-nourished. No distress.  Sitting upright in bed, NAD  HENT:  Head: Normocephalic and atraumatic.  Right Ear: External ear normal.  Left Ear: External ear normal.  Nose: Nose normal.  Mouth/Throat: Oropharynx is clear and moist.  Eyes: Conjunctivae are normal.  Neck: Normal range of motion. No spinous process tenderness and no muscular tenderness present.  Cardiovascular: Normal rate, regular rhythm, normal heart sounds, intact distal pulses and normal pulses.   Pulses:      Radial pulses are 2+ on the right side, and 2+ on the left side.       Posterior tibial pulses are 2+ on the right side, and 2+ on the left side.  Pulmonary/Chest: Effort normal and breath sounds normal. No stridor. No respiratory distress. She has no wheezes. She has no rales.  Abdominal: Soft. She exhibits no distension.  Musculoskeletal: Normal range of motion.       Back:  TTP over lumbar spine Log roll test negative bilaterally, straight leg test positive bilaterally  Neurological: She is alert and oriented to person, place, and time. She has normal strength.  Neurovascularly intact  Skin: Skin is warm and dry. She is not diaphoretic. No  erythema.  Psychiatric: She has a normal mood and affect. Her behavior is normal.    ED Course  Procedures (including critical care time)  DIAGNOSTIC STUDIES: Oxygen Saturation is 100% on RA, normal by my interpretation.    COORDINATION OF CARE:   11:28 AM-Discussed treatment plan which includes an XRAY of her lower back  with pt at bedside and pt agreed to plan.   Labs Review Labs Reviewed - No data to display  Imaging Review No results found.   EKG Interpretation None      MDM   Final diagnoses:  None   Patient with back pain.  No neurological deficits and normal neuro exam.  Patient can walk but states is painful.  No loss of bowel or bladder control.  No concern for cauda  equina.  No fever, night sweats, weight loss, h/o cancer, IVDU.  Patient eloped from the department before her workup was complete, although I doubt serious pathology would have been seen on XRay. Will have patient discharged from the department AMA.  Unable to given return instructions.   I personally performed the services described in this documentation, which was scribed in my presence. The recorded information has been reviewed and is accurate.    Mora BellmanHannah S Codee Bloodworth, PA-C 08/29/13 1257

## 2013-08-29 NOTE — ED Notes (Signed)
PT reports picking up a couch this morning . The back pain stared after picking the couch up.10/10.

## 2013-08-29 NOTE — ED Notes (Signed)
Xray came to pick up patient.   Patient was not in room.  Will continue to look for patient.

## 2013-08-29 NOTE — ED Notes (Signed)
Patient ambulated to restroom and tolerated well.  

## 2013-08-29 NOTE — ED Notes (Signed)
Patient has not returned to room.   

## 2013-08-31 NOTE — ED Provider Notes (Signed)
Medical screening examination/treatment/procedure(s) were performed by non-physician practitioner and as supervising physician I was immediately available for consultation/collaboration.    Hamp Moreland L Maika Mcelveen, MD 08/31/13 1000 

## 2014-03-10 ENCOUNTER — Encounter (HOSPITAL_COMMUNITY): Payer: Self-pay | Admitting: Emergency Medicine

## 2014-10-12 ENCOUNTER — Emergency Department (HOSPITAL_COMMUNITY)
Admission: EM | Admit: 2014-10-12 | Discharge: 2014-10-12 | Disposition: A | Discharge status: Home / Self Care | Payer: Medicaid Other | Attending: Emergency Medicine | Admitting: Emergency Medicine

## 2014-10-12 ENCOUNTER — Emergency Department (HOSPITAL_COMMUNITY): Payer: Medicaid Other

## 2014-10-12 ENCOUNTER — Encounter (HOSPITAL_COMMUNITY): Payer: Self-pay

## 2014-10-12 DIAGNOSIS — Y9389 Activity, other specified: Secondary | ICD-10-CM | POA: Diagnosis not present

## 2014-10-12 DIAGNOSIS — S6992XA Unspecified injury of left wrist, hand and finger(s), initial encounter: Secondary | ICD-10-CM | POA: Diagnosis not present

## 2014-10-12 DIAGNOSIS — Y9289 Other specified places as the place of occurrence of the external cause: Secondary | ICD-10-CM | POA: Diagnosis not present

## 2014-10-12 DIAGNOSIS — Z8659 Personal history of other mental and behavioral disorders: Secondary | ICD-10-CM | POA: Diagnosis not present

## 2014-10-12 DIAGNOSIS — T148 Other injury of unspecified body region: Secondary | ICD-10-CM | POA: Diagnosis not present

## 2014-10-12 DIAGNOSIS — Y998 Other external cause status: Secondary | ICD-10-CM | POA: Diagnosis not present

## 2014-10-12 DIAGNOSIS — W010XXA Fall on same level from slipping, tripping and stumbling without subsequent striking against object, initial encounter: Secondary | ICD-10-CM | POA: Insufficient documentation

## 2014-10-12 DIAGNOSIS — S4992XA Unspecified injury of left shoulder and upper arm, initial encounter: Secondary | ICD-10-CM | POA: Diagnosis present

## 2014-10-12 DIAGNOSIS — T148XXA Other injury of unspecified body region, initial encounter: Secondary | ICD-10-CM

## 2014-10-12 NOTE — ED Notes (Signed)
Pt states she slipped on some water and fell. Complain of pain in her left shoulder and neck

## 2014-10-12 NOTE — ED Provider Notes (Signed)
CSN: [id number]     Arrival date & time 10/12/14  0727 History   First MD Initiated Contact with Patient 10/12/14 [phone]     Chief Complaint  Patient presents with  . Shoulder Pain     (Consider location/radiation/quality/duration/timing/severity/associated sxs/prior Treatment) HPI Comments: Patient presents for evaluation of pain in the left shoulder and arm after a fall. Patient reports that she slipped on water this morning. Patient reports sharp, constant and severe pain from the neck down her arm. Pain worsens with movement. No loss of consciousness. No headache.  Patient is a 44 y.o. female presenting with shoulder pain.  Shoulder Pain Associated symptoms: neck pain     Past Medical History  Diagnosis Date  . Herniated disc   . Depression   . Anxiety    Past Surgical History  Procedure Laterality Date  . Tubal ligation     Family History  Problem Relation Age of Onset  . Cancer Mother    History  Substance Use Topics  . Smoking status: Never Smoker   . Smokeless tobacco: Never Used  . Alcohol Use: No     Comment: 2-1/5's daily    OB History    Gravida Para Term Preterm AB TAB SAB Ectopic Multiple Living   4 4 4             Review of Systems  Musculoskeletal: Positive for neck pain.  All other systems reviewed and are negative.     Allergies  Flexeril; Naproxen; Tramadol; and Hydrocodone  Home Medications   Prior to Admission medications   Not on File   BP 164/107 mmHg  Pulse 97  Temp(Src) 98.9 F (37.2 C) (Oral)  Resp 20  Ht 5\' 2"  (1.575 m)  Wt 140 lb (63.504 kg)  BMI 25.60 kg/m2  SpO2 99%  LMP 09/07/2014 Physical Exam  Constitutional: She is oriented to person, place, and time. She appears well-developed and well-nourished. No distress.  HENT:  Head: Normocephalic and atraumatic.  Right Ear: Hearing normal.  Left Ear: Hearing normal.  Nose: Nose normal.  Mouth/Throat: Oropharynx is clear and moist and mucous membranes are normal.  Eyes:  Conjunctivae and EOM are normal. Pupils are equal, round, and reactive to light.  Neck: Normal range of motion. Neck supple.  Cardiovascular: Regular rhythm, S1 normal and S2 normal.  Exam reveals no gallop and no friction rub.   No murmur heard. Pulmonary/Chest: Effort normal and breath sounds normal. No respiratory distress. She exhibits no tenderness.  Abdominal: Soft. Normal appearance and bowel sounds are normal. There is no hepatosplenomegaly. There is no tenderness. There is no rebound, no guarding, no tenderness at McBurney's point and negative Murphy's sign. No hernia.  Musculoskeletal: Normal range of motion.       Left shoulder: She exhibits tenderness. She exhibits no deformity.       Left elbow: She exhibits no swelling and no deformity. Tenderness found.       Left wrist: She exhibits tenderness.  Neurological: She is alert and oriented to person, place, and time. She has normal strength. No cranial nerve deficit or sensory deficit. Coordination normal. GCS eye subscore is 4. GCS verbal subscore is 5. GCS motor subscore is 6.  Skin: Skin is warm, dry and intact. No rash noted. No cyanosis.  Psychiatric: She has a normal mood and affect. Her speech is normal and behavior is normal. Thought content normal.  Nursing note and vitals reviewed.   ED Course  Procedures (including critical care time)  Labs Review Labs Reviewed - No data to display  Imaging Review No results found.   EKG Interpretation None      MDM   Final diagnoses:  None   contusion  Patient presents to the ER for evaluation of pain from the neck down her left arm after a fall. Patient's pain appears far out of proportion to examination. There is absolutely no swelling, abrasion, laceration, ecchymosis or any visual signs of trauma on the patient. Patient also has an extensive allergy list including tramadol, NSAIDs and hydrocodone. She has admited to opiate abuse in the past. No narcotics  provided.    Gilda Crease, MD 10/12/14 [phone]

## 2014-10-12 NOTE — Discharge Instructions (Signed)
Contusion °A contusion is a deep bruise. Contusions happen when an injury causes bleeding under the skin. Signs of bruising include pain, puffiness (swelling), and discolored skin. The contusion may turn blue, purple, or yellow. °HOME CARE  °· Put ice on the injured area. °¨ Put ice in a plastic bag. °¨ Place a towel between your skin and the bag. °¨ Leave the ice on for 15-20 minutes, 03-04 times a day. °· Only take medicine as told by your doctor. °· Rest the injured area. °· If possible, raise (elevate) the injured area to lessen puffiness. °GET HELP RIGHT AWAY IF:  °· You have more bruising or puffiness. °· You have pain that is getting worse. °· Your puffiness or pain is not helped by medicine. °MAKE SURE YOU:  °· Understand these instructions. °· Will watch your condition. °· Will get help right away if you are not doing well or get worse. °Document Released: 10/12/2007 Document Revised: 07/18/2011 Document Reviewed: 02/28/2011 °[employer]® Patient Information ©2015 [employer]. This information is not intended to replace advice given to you by your health care provider. Make sure you discuss any questions you have with your health care provider. ° °

## 2016-01-23 ENCOUNTER — Emergency Department (HOSPITAL_COMMUNITY)
Admission: EM | Admit: 2016-01-23 | Discharge: 2016-01-23 | Disposition: A | Discharge status: Home / Self Care | Payer: Medicaid Other | Attending: Emergency Medicine | Admitting: Emergency Medicine

## 2016-01-23 ENCOUNTER — Encounter (HOSPITAL_COMMUNITY): Payer: Self-pay | Admitting: Emergency Medicine

## 2016-01-23 DIAGNOSIS — F1910 Other psychoactive substance abuse, uncomplicated: Secondary | ICD-10-CM | POA: Insufficient documentation

## 2016-01-23 MED ORDER — CLONIDINE HCL 0.1 MG PO TABS
0.1000 mg | ORAL_TABLET | Freq: Three times a day (TID) | ORAL | Status: AC | PRN
  Administered 2016-01-23: 0.1 mg via ORAL
  Filled 2016-01-23: qty 1

## 2016-01-23 NOTE — ED Provider Notes (Signed)
[facility]-EMERGENCY DEPT Provider Note   CSN: [id number] Arrival date & time: 01/23/16  1754     History   Chief Complaint Chief Complaint  Patient presents with  . Addiction Problem    heroin, opana, crack, alcohol    HPI Ann Guzman is a 45 y.o. female.  The pt is a drug user - states that she uses IVDU - heroin - last used 2 days ago - drinks daily (alcoholic) - also abusing xanax and opana.    She wants help with detox - she has been in detox in the past twice, once 10 years ago for heroin and once in 1991 for crack.  She denies depression or suicidality  She has shakes, diarrhea, nausea but no diaphoresis.   The history is provided by the patient and medical records.    Past Medical History:  Diagnosis Date  . Anxiety   . Depression   . Herniated disc     There are no active problems to display for this patient.   Past Surgical History:  Procedure Laterality Date  . TUBAL LIGATION      OB History    Gravida Para Term Preterm AB Living   4 4 4          SAB TAB Ectopic Multiple Live Births                   Home Medications    Prior to Admission medications   Not on File    Family History Family History  Problem Relation Age of Onset  . Cancer Mother     Social History Social History  Substance Use Topics  . Smoking status: Never Smoker  . Smokeless tobacco: Never Used  . Alcohol use No     Comment: 2-1/5's daily      Allergies   Flexeril [cyclobenzaprine]; Naproxen; Tramadol; and Hydrocodone   Review of Systems Review of Systems  All other systems reviewed and are negative.    Physical Exam Updated Vital Signs BP 138/74 (BP Location: Left Arm)   Pulse 102   Temp 98.4 F (36.9 C) (Oral)   Resp 22   SpO2 92%   Physical Exam  Constitutional: She appears well-developed and well-nourished. No distress.  HENT:  Head: Normocephalic and atraumatic.  Mouth/Throat: Oropharynx is clear and moist. No oropharyngeal exudate.  Poor  dentition  Eyes: Conjunctivae and EOM are normal. Pupils are equal, round, and reactive to light. Right eye exhibits no discharge. Left eye exhibits no discharge. No scleral icterus.  Neck: Normal range of motion. Neck supple. No JVD present. No thyromegaly present.  Cardiovascular: Normal rate, regular rhythm, normal heart sounds and intact distal pulses.  Exam reveals no gallop and no friction rub.   No murmur heard. Pulmonary/Chest: Effort normal and breath sounds normal. No respiratory distress. She has no wheezes. She has no rales.  Abdominal: Soft. Bowel sounds are normal. She exhibits no distension and no mass. There is no tenderness.  Musculoskeletal: Normal range of motion. She exhibits no edema or tenderness.  Lymphadenopathy:    She has no cervical adenopathy.  Neurological: She is alert. Coordination normal.  Moves all extremities X 4 - normal speech, normal CN 3-12.  Has mild tremor - she can stop with intentional movements.  Skin: Skin is warm and dry. No rash noted. No erythema.  Psychiatric: She has a normal mood and affect. Thought content normal.  Nursing note and vitals reviewed.    ED Treatments /  Results  Labs (all labs ordered are listed, but only abnormal results are displayed) Labs Reviewed  COMPREHENSIVE METABOLIC PANEL  CBC WITH DIFFERENTIAL/PLATELET  URINE RAPID DRUG SCREEN, HOSP PERFORMED  ETHANOL    Radiology No results found.  Procedures Procedures (including critical care time)  Medications Ordered in ED Medications  cloNIDine (CATAPRES) tablet 0.1 mg (not administered)     Initial Impression / Assessment and Plan / ED Course  I have reviewed the triage vital signs and the nursing notes.  Pertinent labs & imaging results that were available during my care of the patient were reviewed by me and considered in my medical decision making (see chart for details).  Clinical Course   Clonidine Pt refuses blood work Will d/c  Final Clinical  Impressions(s) / ED Diagnoses   Final diagnoses:  None    New Prescriptions New Prescriptions   No medications on file     Eber Hong, MD 01/23/16 1610

## 2016-01-23 NOTE — ED Triage Notes (Signed)
Pt presents via EMS with c/o drug abuse. 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 States last used heroin and opana 2 days ago. States used cack and alcohol today.

## 2016-01-23 NOTE — Discharge Instructions (Signed)
Substance Abuse Treatment Programs ° °Intensive Outpatient Programs °[facility]     °[address]      °[place], [place]                   °[phone]      ° °[facility] °[address] °[place], [place] °[phone] ° °[facility]     °(Inpatient and outpatient)     °[address]           °[phone]   ° °[facility] °336-288-1484 (Suboxone and Methadone) ° °[address]      °[place], [place] [address]      °336-882-2125      ° °[address] °[place], [place] °852-3033 ° °[facility] (Outpatient/Inpatient, Chemical)    °(insurance only) [phone]      °       °[facility] (Groups & Residential) °[place], [place] °336-389-1413 ° °   °[facility]     °[address]     °[place], [place]      °[phone]      ° °[facility] (for caregivers and family) °[address] °[place], [place] °336-299-4655 ° ° ° ° ° °Residential Treatment Programs °[facility]      °[address], [place] , [place] [address]  °([phone]      ° °[facility] °[address] [place], [place] [address] °[phone] ° °[facility]        °336-248-8914      ° °[facility] °1-800-659-3381 ° °[facility]             °[address]                                         °[place], [place]                                                °877-615-2722 or 336-784-9470                              ° °[facility] [place] °[address] °[place], [address] °1.877.941.8954 ° °[facility]    °[address]      °[place], [place]     °[phone]      ° °[facility] °[address] °[place], [place] °336-285-9073 ° °[facility]   °[address]     °[place], [place] [address]     °336-899-1550      °Admissions: 8am-3pm M-F ° °[facility] °[address] °[place],  [place] °336-227-7417 ° °[facility]: Residential Program (90 Days)   °[place],       °336-725-8389 or 800-758-6077    ° °[facility] °[place],  °(Walk in Hours over the weekend or by referral) ° °[facility] °[address], [place],  [address] °(336) [phone] ° °Crisis Mobile: [facility]:  1-877-626-1772 (for crisis response 24 hours a day) °[facility] Hotline:      1-800-256-2452 °Outpatient Psychiatry and Counseling ° °[facility]: Mobile Crisis   Management 24 hours:  1-877-626-1772 ° °[facility] sliding scale fee and walk in schedule: M-F 8am-12pm/1pm-3pm °[address]  °[place], [place] [address] °[phone] ° °[facility] °[address] °[place], [place] [address] °336-703-9650 ° °[facility] (Former[facility] known [facility])- new patient walk-in appointments available Monday - Friday 8am -3pm.          °[address] °[place], [place] [address] °336-676-6840 or crisis line- 336-676-6905 ° °[facility]/ Intensive Outpatient Therapy Program °[address] °[place], [place] [address] °336-832-9804 ° °[facility]                  °Crisis Services      °[phone]      °[address]     °[place], [place] [address]                ° °[facility]   °[facility] °[phone] °[address] °[place], [place] [address] ° ° °[facility]          °[address],  °[place], [place] [address]       °([phone] [phone] ° °[facility] °[address] °[place], [place] [address] °336-292-1510 ° °[facility]    °[address]    °[place], [place] [address]     °336-632-3505      ° °Parish McKinney, MD     °[address]     °[place] [address]     °336-282-1251     °  °[facility] °[address] °[place] [address] ° °[facility]     °[address]     °[place], [place]      °336-542-2076      ° °[facility] °Shamsher Ahluwalia, MD °[address] °[place], [place] [address] °336-420-9558 ° °[facility]     °[address]     °[place], [place] [address]     °336-274-1237      ° °[facility] °[address] °[place], [place] [address] °336-854-4450 °Resources for Temporary Residential Assistance/Crisis Centers ° °DAY CENTERS °[facility] °M-F 8am-3pm   °[address] [place], [place] [address]   336-332-0824 °Services include: laundry, barbering, support groups, case management, phone  & computer access, showers, AA/NA mtgs, mental health/substance abuse nurse, job skills class, disability information, VA assistance, spiritual classes, etc.  ° °HOMELESS SHELTERS ° °[facility]     °[facility]   °[address], [place]     °336.271.5959       °       °[facility] (women and children)       °[address] °[place], [place] [address] °336-275-0820 °[email or web address] for application and process °Application Required ° °[facility]   °[address]    °[place] [address]     °[phone]       °             °[facility] °[address] °[place], [place] [address] °336.273.5572 °336-235-0363(schedule application appt.) °Application Required ° °[facility] (women only)    °[address]     °[place], [place] [address]     °336-884-1039      °  Intake starts 6pm daily °Need valid ID, SSC, & Police report °[facility] [place] °[address] °[place], [place] °336-881-5420 °Application Required ° °[facility] (men only)     °[address]      °[place], [place]     °[phone]      ° °[facility] °(Pregnant women only) °[address] °[place], [place] °336-275-0206 ° °[facility]      °[address]      °[place], [place] [address]     °336-722-9951      °       °[facility] °[address] °[place], [place] °336-723-1848 °90 day commitment/SA/Application process ° °[facility](men only)     °[address]     °[place], [place]     °336-748-1962       °Check-in at 7pm     °       °[facility] °[address] °[place], [place] [address] °336-248-6684 °Men/Women/Women and Children must be there by 7 pm ° °[facility] °[place], [place] °336-722-8721                ° °

## 2016-01-23 NOTE — ED Notes (Signed)
Upon entering the room to obtain a blood draw the lab tech was told she could not draw the blood by the pt. Dr Hyacinth MeekerMiller made aware of same.

## 2016-01-23 NOTE — ED Notes (Signed)
Pt given discharge instructions. Pt informed of the out patient resources list provided by EDP the patient. Pt says she does not want out pt treatment. This nurse informed pt that she refused to have blood drawn in order for us to provide any additional services. Pt stated upon leaving [place].

## 2016-12-25 ENCOUNTER — Emergency Department (HOSPITAL_COMMUNITY)
Admission: EM | Admit: 2016-12-25 | Discharge: 2016-12-25 | Disposition: A | Discharge status: Home / Self Care | Payer: Medicaid Other | Attending: Emergency Medicine | Admitting: Emergency Medicine

## 2016-12-25 ENCOUNTER — Encounter (HOSPITAL_COMMUNITY): Payer: Self-pay | Admitting: *Deleted

## 2016-12-25 DIAGNOSIS — Y999 Unspecified external cause status: Secondary | ICD-10-CM | POA: Insufficient documentation

## 2016-12-25 DIAGNOSIS — Y939 Activity, unspecified: Secondary | ICD-10-CM | POA: Insufficient documentation

## 2016-12-25 DIAGNOSIS — T63304A Toxic effect of unspecified spider venom, undetermined, initial encounter: Secondary | ICD-10-CM

## 2016-12-25 DIAGNOSIS — Y929 Unspecified place or not applicable: Secondary | ICD-10-CM | POA: Insufficient documentation

## 2016-12-25 DIAGNOSIS — W57XXXA Bitten or stung by nonvenomous insect and other nonvenomous arthropods, initial encounter: Secondary | ICD-10-CM | POA: Insufficient documentation

## 2016-12-25 DIAGNOSIS — T63301A Toxic effect of unspecified spider venom, accidental (unintentional), initial encounter: Secondary | ICD-10-CM | POA: Insufficient documentation

## 2016-12-25 MED ORDER — DOUBLE ANTIBIOTIC 500-10000 UNIT/GM EX OINT
TOPICAL_OINTMENT | Freq: Once | CUTANEOUS | Status: AC
  Administered 2016-12-25: 1 via TOPICAL
  Filled 2016-12-25: qty 1

## 2016-12-25 MED ORDER — SULFAMETHOXAZOLE-TRIMETHOPRIM 800-160 MG PO TABS: 1.0000 | ORAL_TABLET | Freq: Two times a day (BID) | ORAL | 0 refills | Status: AC

## 2016-12-25 MED ORDER — SULFAMETHOXAZOLE-TRIMETHOPRIM 800-160 MG PO TABS
1.0000 | ORAL_TABLET | Freq: Once | ORAL | Status: AC
  Administered 2016-12-25: 1 via ORAL
  Filled 2016-12-25: qty 1

## 2016-12-25 NOTE — ED Provider Notes (Signed)
[facility]-EM[facility]ENCY DEPT Provider Note   CSN: [id number] Arrival date & time: 12/25/16  1222     History   Chief Complaint Chief Complaint  Patient presents with  . Insect Bite    HPI Ann Guzman is a 46 y.o. female.  Patient presents with spider bite to left forearm for several days.  No fever, sweats, chills. No history of diabetes. Severity wound is moderate to severe. Makes symptoms better or worse.      Past Medical History:  Diagnosis Date  . Anxiety   . Depression   . Herniated disc     There are no active problems to display for this patient.   Past Surgical History:  Procedure Laterality Date  . TUBAL LIGATION      OB History    Gravida Para Term Preterm AB Living   4 4 4          SAB TAB Ectopic Multiple Live Births                   Home Medications    Prior to Admission medications   Medication Sig Start Date End Date Taking? Authorizing Provider  acetaminophen (TYLENOL) 500 MG tablet Take 1,000 mg by mouth every 6 (six) hours as needed for mild pain or headache.   Yes [provider]  sulfamethoxazole-trimethoprim (BACTRIM DS,SEPTRA DS) 800-160 MG tablet Take 1 tablet by mouth 2 (two) times daily. 12/25/16 01/01/17  Donnetta Hutching, MD    Family History Family History  Problem Relation Age of Onset  . Cancer Mother     Social History Social History  Substance Use Topics  . Smoking status: Never Smoker  . Smokeless tobacco: Never Used  . Alcohol use No     Comment: 2-1/5's daily      Allergies   Flexeril [cyclobenzaprine]; Naproxen; Tramadol; and Hydrocodone   Review of Systems Review of Systems  All other systems reviewed and are negative.    Physical Exam Updated Vital Signs BP 114/72 (BP Location: Left Arm)   Pulse 72   Temp 98.5 F (36.9 C) (Oral)   Resp 18   Ht 5\' 2"  (1.575 m)   Wt 59 kg (130 lb)   SpO2 97%   BMI 23.78 kg/m   Physical Exam  Constitutional: She is oriented to person, place, and time. She  appears well-developed and well-nourished.  HENT:  Head: Normocephalic and atraumatic.  Eyes: Conjunctivae are normal.  Neck: Neck supple.  Musculoskeletal: Normal range of motion.  Neurological: She is alert and oriented to person, place, and time.  Skin:  Left forearm: Macerated area approximately 2 cm in diameter on mid anterior aspect of forearm. There is no pus in the wound.  Psychiatric: She has a normal mood and affect. Her behavior is normal.  Nursing note and vitals reviewed.    ED Treatments / Results  Labs (all labs ordered are listed, but only abnormal results are displayed) Labs Reviewed - No data to display  EKG  EKG Interpretation None       Radiology No results found.  Procedures Procedures (including critical care time)  Medications Ordered in ED Medications  sulfamethoxazole-trimethoprim (BACTRIM DS,SEPTRA DS) 800-160 MG per tablet 1 tablet (1 tablet Oral Given 12/25/16 1353)  polymixin-bacitracin (POLYSPORIN) ointment (1 application Topical Given 12/25/16 1353)     Initial Impression / Assessment and Plan / ED Course  I have reviewed the triage vital signs and the nursing notes.  Pertinent labs & imaging results  that were available during my care of the patient were reviewed by me and considered in my medical decision making (see chart for details).     Patient clearly needs antibiotics. Will start Septra DS twice a day. She understands to return if the wound becomes worse or any evidence of redness going up her arm, fever, or chills.  Final Clinical Impressions(s) / ED Diagnoses   Final diagnoses:  Spider bite wound, undetermined intent, initial encounter    New Prescriptions New Prescriptions   SULFAMETHOXAZOLE-TRIMETHOPRIM (BACTRIM DS,SEPTRA DS) 800-160 MG TABLET    Take 1 tablet by mouth 2 (two) times daily.     Donnetta Hutching, MD 12/29/16 [phone]

## 2016-12-25 NOTE — ED Triage Notes (Signed)
Pt states she felt something bite her right forearm then looked over and saw a spider on her arm. She killed it. Pt has wound noted to left wrist. Area is open at this time. States this happened 2 days ago.

## 2016-12-25 NOTE — Discharge Instructions (Signed)
You can shower and keep wound clean. Simple protective dressing. Prescription for antibiotic. Return if infection worsens, specifically redness going up the arm, fever, chills, drainage of large amounts of pus

## 2017-02-13 ENCOUNTER — Emergency Department (HOSPITAL_COMMUNITY): Payer: Self-pay

## 2017-02-13 ENCOUNTER — Encounter (HOSPITAL_COMMUNITY): Payer: Self-pay | Admitting: *Deleted

## 2017-02-13 ENCOUNTER — Observation Stay (HOSPITAL_COMMUNITY)
Admission: EM | Admit: 2017-02-13 | Discharge: 2017-02-14 | Disposition: A | Discharge status: Home / Self Care | Payer: Self-pay | Attending: Family Medicine | Admitting: Family Medicine

## 2017-02-13 DIAGNOSIS — F419 Anxiety disorder, unspecified: Secondary | ICD-10-CM | POA: Insufficient documentation

## 2017-02-13 DIAGNOSIS — R0902 Hypoxemia: Secondary | ICD-10-CM | POA: Insufficient documentation

## 2017-02-13 DIAGNOSIS — D649 Anemia, unspecified: Secondary | ICD-10-CM | POA: Insufficient documentation

## 2017-02-13 DIAGNOSIS — Z809 Family history of malignant neoplasm, unspecified: Secondary | ICD-10-CM | POA: Insufficient documentation

## 2017-02-13 DIAGNOSIS — R9431 Abnormal electrocardiogram [ECG] [EKG]: Secondary | ICD-10-CM | POA: Diagnosis present

## 2017-02-13 DIAGNOSIS — T401X1A Poisoning by heroin, accidental (unintentional), initial encounter: Principal | ICD-10-CM | POA: Insufficient documentation

## 2017-02-13 DIAGNOSIS — F1910 Other psychoactive substance abuse, uncomplicated: Secondary | ICD-10-CM | POA: Insufficient documentation

## 2017-02-13 DIAGNOSIS — F329 Major depressive disorder, single episode, unspecified: Secondary | ICD-10-CM | POA: Insufficient documentation

## 2017-02-13 DIAGNOSIS — T40604A Poisoning by unspecified narcotics, undetermined, initial encounter: Secondary | ICD-10-CM

## 2017-02-13 DIAGNOSIS — R451 Restlessness and agitation: Secondary | ICD-10-CM | POA: Insufficient documentation

## 2017-02-13 DIAGNOSIS — R410 Disorientation, unspecified: Secondary | ICD-10-CM | POA: Insufficient documentation

## 2017-02-13 DIAGNOSIS — E876 Hypokalemia: Secondary | ICD-10-CM | POA: Insufficient documentation

## 2017-02-13 DIAGNOSIS — Z885 Allergy status to narcotic agent status: Secondary | ICD-10-CM | POA: Insufficient documentation

## 2017-02-13 DIAGNOSIS — Z888 Allergy status to other drugs, medicaments and biological substances status: Secondary | ICD-10-CM | POA: Insufficient documentation

## 2017-02-13 DIAGNOSIS — I4581 Long QT syndrome: Secondary | ICD-10-CM | POA: Insufficient documentation

## 2017-02-13 DIAGNOSIS — F1410 Cocaine abuse, uncomplicated: Secondary | ICD-10-CM | POA: Insufficient documentation

## 2017-02-13 LAB — RAPID URINE DRUG SCREEN, HOSP PERFORMED
AMPHETAMINES: NOT DETECTED
Barbiturates: NOT DETECTED
Benzodiazepines: POSITIVE — AB
Cocaine: POSITIVE — AB
Opiates: POSITIVE — AB
Tetrahydrocannabinol: NOT DETECTED

## 2017-02-13 LAB — URINALYSIS, ROUTINE W REFLEX MICROSCOPIC
BILIRUBIN URINE: NEGATIVE
GLUCOSE, UA: NEGATIVE mg/dL
HGB URINE DIPSTICK: NEGATIVE
Ketones, ur: NEGATIVE mg/dL
Leukocytes, UA: NEGATIVE
NITRITE: NEGATIVE
PH: 5 (ref 5.0–8.0)
Protein, ur: NEGATIVE mg/dL
SPECIFIC GRAVITY, URINE: 1.015 (ref 1.005–1.030)

## 2017-02-13 LAB — COMPREHENSIVE METABOLIC PANEL
ALK PHOS: 45 U/L (ref 38–126)
ALT: 60 U/L — AB (ref 14–54)
ANION GAP: 11 (ref 5–15)
AST: 69 U/L — ABNORMAL HIGH (ref 15–41)
Albumin: 2.9 g/dL — ABNORMAL LOW (ref 3.5–5.0)
BILIRUBIN TOTAL: 0.4 mg/dL (ref 0.3–1.2)
BUN: 11 mg/dL (ref 6–20)
CALCIUM: 7.7 mg/dL — AB (ref 8.9–10.3)
CO2: 22 mmol/L (ref 22–32)
CREATININE: 0.66 mg/dL (ref 0.44–1.00)
Chloride: 108 mmol/L (ref 101–111)
GFR calc non Af Amer: >60 mL/min (ref >60)
GLUCOSE: 119 mg/dL — AB (ref 65–99)
Potassium: 3.1 mmol/L — ABNORMAL LOW (ref 3.5–5.1)
Sodium: 141 mmol/L (ref 135–145)
TOTAL PROTEIN: 6.6 g/dL (ref 6.5–8.1)

## 2017-02-13 LAB — RETICULOCYTES
RBC.: 3.45 MIL/uL — ABNORMAL LOW (ref 3.87–5.11)
Retic Count, Absolute: 48.3 10*3/uL (ref 19.0–186.0)
Retic Ct Pct: 1.4 % (ref 0.4–3.1)

## 2017-02-13 LAB — CBC WITH DIFFERENTIAL/PLATELET
Basophils Absolute: 0 10*3/uL (ref 0.0–0.1)
Basophils Relative: 0 %
EOS ABS: 0 10*3/uL (ref 0.0–0.7)
EOS PCT: 1 %
HCT: 28.8 % — ABNORMAL LOW (ref 36.0–46.0)
Hemoglobin: 9.2 g/dL — ABNORMAL LOW (ref 12.0–15.0)
LYMPHS ABS: 1.7 10*3/uL (ref 0.7–4.0)
LYMPHS PCT: 23 %
MCH: 27.9 pg (ref 26.0–34.0)
MCHC: 31.9 g/dL (ref 30.0–36.0)
MCV: 87.3 fL (ref 78.0–100.0)
MONO ABS: 0.3 10*3/uL (ref 0.1–1.0)
MONOS PCT: 3 %
NEUTROS PCT: 73 %
Neutro Abs: 5.6 10*3/uL (ref 1.7–7.7)
PLATELETS: 248 10*3/uL (ref 150–400)
RBC: 3.3 MIL/uL — ABNORMAL LOW (ref 3.87–5.11)
RDW: 15 % (ref 11.5–15.5)
WBC: 7.6 10*3/uL (ref 4.0–10.5)

## 2017-02-13 LAB — MRSA PCR SCREENING: MRSA BY PCR: NEGATIVE

## 2017-02-13 LAB — CBG MONITORING, ED: Glucose-Capillary: 143 mg/dL — ABNORMAL HIGH (ref 65–99)

## 2017-02-13 LAB — SALICYLATE LEVEL: Salicylate Lvl: <7 mg/dL (ref 2.8–30.0)

## 2017-02-13 LAB — I-STAT BETA HCG BLOOD, ED (MC, WL, AP ONLY)

## 2017-02-13 LAB — ACETAMINOPHEN LEVEL

## 2017-02-13 LAB — ETHANOL

## 2017-02-13 MED ORDER — POTASSIUM CHLORIDE IN NACL 40-0.9 MEQ/L-% IV SOLN
INTRAVENOUS | Status: AC
  Administered 2017-02-13 – 2017-02-14 (×2): 125 mL/h via INTRAVENOUS
  Filled 2017-02-13 (×3): qty 1000

## 2017-02-13 MED ORDER — SODIUM CHLORIDE 0.9 % IV SOLN
INTRAVENOUS | Status: DC
  Administered 2017-02-13: 17:00:00 via INTRAVENOUS

## 2017-02-13 MED ORDER — LORAZEPAM 2 MG/ML IJ SOLN
2.0000 mg | Freq: Once | INTRAMUSCULAR | Status: AC
  Administered 2017-02-13: 2 mg via INTRAVENOUS
  Filled 2017-02-13: qty 1

## 2017-02-13 MED ORDER — ENOXAPARIN SODIUM 40 MG/0.4ML ~~LOC~~ SOLN
40.0000 mg | SUBCUTANEOUS | Status: DC
  Administered 2017-02-13: 40 mg via SUBCUTANEOUS
  Filled 2017-02-13: qty 0.4

## 2017-02-13 MED ORDER — LORAZEPAM 2 MG/ML IJ SOLN
1.0000 mg | INTRAMUSCULAR | Status: DC | PRN
  Administered 2017-02-14: 2 mg via INTRAVENOUS
  Filled 2017-02-13: qty 1

## 2017-02-13 MED ORDER — ACETAMINOPHEN 650 MG RE SUPP: 650.0000 mg | Freq: Four times a day (QID) | RECTAL | Status: DC | PRN

## 2017-02-13 MED ORDER — ACETAMINOPHEN 325 MG PO TABS: 650.0000 mg | ORAL_TABLET | Freq: Four times a day (QID) | ORAL | Status: DC | PRN

## 2017-02-13 MED ORDER — HYDROMORPHONE HCL 1 MG/ML IJ SOLN
2.0000 mg | Freq: Once | INTRAMUSCULAR | Status: AC
  Administered 2017-02-13: 2 mg via INTRAVENOUS
  Filled 2017-02-13: qty 2

## 2017-02-13 MED ORDER — HYDROMORPHONE HCL 1 MG/ML IJ SOLN: 1.0000 mg | INTRAMUSCULAR | Status: DC | PRN

## 2017-02-13 MED ORDER — SODIUM CHLORIDE 0.9% FLUSH
3.0000 mL | Freq: Two times a day (BID) | INTRAVENOUS | Status: DC
  Administered 2017-02-13 – 2017-02-14 (×2): 3 mL via INTRAVENOUS

## 2017-02-13 NOTE — ED Triage Notes (Addendum)
Per EMS, pt brought in for possible heroin overdose. Pt was seen by bystander stumbling down the road half naked. Pt was passed out on the side of the rode upon EMS arrival. Pt told EMS she took heroin. Pt initially had RR 6 and would only breathe with painful stimuli. Pt was given 0.4 narcan and became agitated. Pt was then given 7.5mg  versed for agitation. Pt is still agitated upon arrival.   HR 90 before narcan, 130 after BP 102/60 before narcan O2 97% CBG 104

## 2017-02-13 NOTE — ED Notes (Signed)
Patient too combative at this time to draw blood.

## 2017-02-13 NOTE — ED Notes (Signed)
Attempted to call report, unable to reach nurse at this time.

## 2017-02-13 NOTE — ED Notes (Addendum)
Pt had 5 open (appearing to be used) insulin syringes.  Disposed of in sharps container for staff and patient safety. Delay in blood draw due to patient agitation.

## 2017-02-13 NOTE — ED Notes (Signed)
Patient still combative and thrashing in bed. Blood draw will have to wait.

## 2017-02-13 NOTE — H&P (Signed)
History and Physical    Ann Guzman JYN:[id number] DOB: 08-Nov-1970 DOA: 02/13/2017  PCP: Patient, No Pcp Per   Patient coming from: Home  Chief Complaint: Agitation, heroin overdose s/p Narcan  HPI: Ann Guzman is a 46 y.o. female with medical history significant for depression, anxiety, and polysubstance abuse, presenting to the emergency department after EMS was called out by passerbys who saw her staggering on the road half dressed. She was very poorly responsive upon EMS arrival, reportedly breathing approximately 6 times per minute. She was treated with Narcan on the scene and became very agitated. She was then treated with Versed and brought into the ED. She reported to EMS that she had taken heroin shortly prior to this. Several syringes were found with the patient.  ED Course: Upon arrival to the ED, patient is found to be slightly hypoxic, normalizing with 3 L per minute of supplemental oxygen, and with vitals otherwise stable. EKG features a sinus tachycardia with rate 129, diffuse repolarization abnormality, and QTc interval prolonged 521 ms. Noncontrast head CT is negative for acute intracranial abnormality. Chemistry panels notable for potassium of 3.1 and AST and ALT both 60. CBC features a normocytic anemia with hemoglobin of 9.2 and no recent priors available for comparison. Urinalysis is unremarkable, UDS is positive for benzodiazepines, opiates, and cocaine. Acetaminophen and salicylate levels are undetectable. Patient was treated with 2 mg of Ativan in the emergency department and started on IV fluids. She continued to be very agitated, risking harm to herself and staff, and she was treated with 2 mg IV Dilaudid. She then became very somnolent, but hemodynamically stable and with adequate respirations. She will be admitted to the stepdown unit for ongoing evaluation and management of this.  Review of Systems:  Unable to complete ROS secondary to patient's clinical condition  with agitation.  Past Medical History:  Diagnosis Date  . Anxiety   . Depression   . Herniated disc     Past Surgical History:  Procedure Laterality Date  . TUBAL LIGATION       reports that she has never smoked. She has never used smokeless tobacco. She reports that she does not drink alcohol or use drugs.  Allergies  Allergen Reactions  . Flexeril [Cyclobenzaprine] Nausea And Vomiting  . Naproxen Nausea And Vomiting  . Tramadol Hives  . Hydrocodone Itching and Rash    Family History  Problem Relation Age of Onset  . Cancer Mother      Prior to Admission medications   Medication Sig Start Date End Date Taking? Authorizing Provider  acetaminophen (TYLENOL) 500 MG tablet Take 1,000 mg by mouth every 6 (six) hours as needed for mild pain or headache.    [provider]    Physical Exam: Vitals:   02/13/17 1806 02/13/17 1830 02/13/17 1848 02/13/17 1900  BP: (!) 106/49 (!) 99/42 98/60 (!) 90/57  Pulse: 76 79 74 74  Resp: SpO2: 94% 93% 95% 93%      Constitutional: obtunded, no pallor, no diaphoresis Eyes: PERTLA, lids and conjunctivae normal ENMT: Mucous membranes are moist. Posterior pharynx clear of any exudate or lesions.   Neck: normal, supple, no masses, no thyromegaly Respiratory: clear to auscultation bilaterally, no wheezing, no crackles. Normal respiratory effort.   Cardiovascular: S1 & S2 heard, regular rate and rhythm. No significant JVD. Abdomen: No distension, no tenderness, no masses palpated. Bowel sounds normal.  Musculoskeletal: no clubbing / cyanosis. No joint deformity upper and  lower extremities.   Skin: no significant rashes, lesions, ulcers. Warm, dry, well-perfused. Neurologic: No facial asymmetry, PERRL. Patellar DTR's normal.   Psychiatric: Difficult to assess given the clinical scenario.     Labs on Admission: I have personally reviewed following labs and imaging studies  CBC:  Recent Labs Lab 02/13/17 1719    WBC 7.6  NEUTROABS 5.6  HGB 9.2*  HCT 28.8*  MCV 87.3  PLT 248   Basic Metabolic Panel:  Recent Labs Lab 02/13/17 1719  NA 141  K 3.1*  CL 108  CO2 22  GLUCOSE 119*  BUN 11  CREATININE 0.66  CALCIUM 7.7*   GFR: CrCl cannot be calculated (Unknown ideal weight.). Liver Function Tests:  Recent Labs Lab 02/13/17 1719  AST 69*  ALT 60*  ALKPHOS 45  BILITOT 0.4  PROT 6.6  ALBUMIN 2.9*   No results for input(s): LIPASE, AMYLASE in the last 168 hours. No results for input(s): AMMONIA in the last 168 hours. Coagulation Profile: No results for input(s): INR, PROTIME in the last 168 hours. Cardiac Enzymes: No results for input(s): CKTOTAL, CKMB, CKMBINDEX, TROPONINI in the last 168 hours. BNP (last 3 results) No results for input(s): PROBNP in the last 8760 hours. HbA1C: No results for input(s): HGBA1C in the last 72 hours. CBG:  Recent Labs Lab 02/13/17 1700  GLUCAP 143*   Lipid Profile: No results for input(s): CHOL, HDL, LDLCALC, TRIG, CHOLHDL, LDLDIRECT in the last 72 hours. Thyroid Function Tests: No results for input(s): TSH, T4TOTAL, FREET4, T3FREE, THYROIDAB in the last 72 hours. Anemia Panel: No results for input(s): VITAMINB12, FOLATE, FERRITIN, TIBC, IRON, RETICCTPCT in the last 72 hours. Urine analysis:    Component Value Date/Time   COLORURINE YELLOW 02/13/2017 1632   APPEARANCEUR CLEAR 02/13/2017 1632   LABSPEC 1.015 02/13/2017 1632   PHURINE 5.0 02/13/2017 1632   GLUCOSEU NEGATIVE 02/13/2017 1632   HGBUR NEGATIVE 02/13/2017 1632   BILIRUBINUR NEGATIVE 02/13/2017 1632   KETONESUR NEGATIVE 02/13/2017 1632   PROTEINUR NEGATIVE 02/13/2017 1632   UROBILINOGEN 0.2 06/18/2013 1123   NITRITE NEGATIVE 02/13/2017 1632   LEUKOCYTESUR NEGATIVE 02/13/2017 1632   Sepsis Labs: (procalcitonin:4,lacticidven:4) )No results found for this or any previous visit (from the past 240 hour(s)).   Radiological Exams on Admission: Ct Head Wo  Contrast  Result Date: 02/13/2017 CLINICAL DATA:  Possible heroin overdose, seen stumbling down the road half naked, passed out the site of the road, and decreased breathing, altered level of consciousness EXAM: CT HEAD WITHOUT CONTRAST TECHNIQUE: Contiguous axial images were obtained from the base of the skull through the vertex without intravenous contrast. COMPARISON:  None FINDINGS: Brain: Normal ventricular morphology. No midline shift or mass effect. Normal appearance of brain parenchyma. No intracranial hemorrhage, mass lesion, evidence of acute infarction, or extra-axial fluid collection. Scattered dural calcifications in falx. Vascular: Normal appearance Skull: Normal appearance Sinuses/Orbits: Clear Other: N/A IMPRESSION: No acute intracranial abnormalities. Electronically Signed   By: Ulyses Southward M.D.   On: 02/13/2017 18:26    EKG: Independently reviewed. Sinus tachycardia (rate 129), diffuse repolarization abnormality, QTc interval 521 ms.   Assessment/Plan  1. Agitation  - Likely secondary to detox in setting of Narcan administration  - Pt was treated with Versed in the field, 2 mg IV Ativan in ED, but continued to be very agitated and combative - Given the prolonged QT interval, antipsychotics were avoided and she was treated with Dilaudid in ED  - Pt is somnolent after Dilaudid was administered,  and will be monitored in SDU with anticipation of recurrent agitation  - Continue Ativan prn, supportive care  2. Heroin overdose  - Pt staggering in road only partially clothed, breathing 6/min on EMS arrival  - Reported to EMS that she had taken heroin, UDS also positive for cocaine and benzodiazepines  - She was treated with Narcan by EMS, resulting in extreme agitation as discussed above  - Plan to continue supportive care, close-monitoring in SDU, check HIV and viral hepatitis, consult with social work    3. Hypokalemia  - Serum potassium is 3.1 on admission with prolonged QTc  -  KCl has been added to her IVF, cardiac monitoring will be continued, and chem panel repeated in am   4. Anemia  - Hgb is 9.2 on admission with no recent priors available to compare  - There is no bleeding evident  - Check anemia panel    5. Prolonged QTc interval  - QTc interval is 521 ms on admission - Hypokalemia and heroin likely contributing  - KCl added to IVF, 1 mg IV mag given empirically, continue cardiac monitoring, avoid offending agents, repeat EKG in am    DVT prophylaxis: sq Lovenox Code Status: Full  Family Communication: Discussed with patient Disposition Plan: Observe in SDU Consults called: None Admission status: Observation    Briscoe Deutscher, MD [facility] Pager [phone]  If 7PM-7AM, please contact night-coverage [email or web address] Password [email or web address]  02/13/2017, 7:28 PM

## 2017-02-13 NOTE — ED Provider Notes (Addendum)
[facility]-EMERGENCY DEPT Provider Note   CSN: [id number] Arrival date & time: 02/13/17  1557     History   Chief Complaint Chief Complaint  Patient presents with  . Drug Overdose    HPI Ann Guzman is a 46 y.o. female.  HPI History is only from EMS and nursing at this time. Reportedly bystanders noted the patient to be staggering by the side of the road partially undressed. Upon EMS arrival patient was on the ground and poorly responsive with respirations of 6 and only responsive to deep painful stimuli.. Narcan was administered and patient became combative and agitated. They administered 6 mg of Versed on route to try to calm the patient. On arrival she remained confused agitated and combative. IV is established by EMS with 500 mL fluid bolus administered. EKG obtained prior to Narcan is normal with a rate of 84, normal intervals and no ST changes. Reported blood pressures prior to arrival were systolics of approximately 102/60, CBG 104 and O2 sat 97%. Now for my evaluation, patient is agitated and confused. She does not make intelligible responses to questions. She does occasionally say "no" or "let go". 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 EMR indicates history of polysubstance abuse. Past Medical History:  Diagnosis Date  . Anxiety   . Depression   . Herniated disc     Patient Active Problem List   Diagnosis Date Noted  . Hypokalemia 02/13/2017  . Normocytic anemia 02/13/2017  . 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 Prolonged QT interval 02/13/2017    Past Surgical History:  Procedure Laterality Date  . TUBAL LIGATION      OB History    Gravida Para Term Preterm AB Living   SAB TAB Ectopic Multiple Live Births                   Home Medications    Prior to Admission medications   Medication Sig Start Date End Date Taking? Authorizing Provider  acetaminophen (TYLENOL) 500 MG tablet Take 1,000 mg by mouth  every 6 (six) hours as needed for mild pain or headache.    [provider]    Family History Family History  Problem Relation Age of Onset  . Cancer Mother     Social History Social History  Substance Use Topics  . Smoking status: Never Smoker  . Smokeless tobacco: Never Used  . Alcohol use No     Comment: 2-1/5's daily      Allergies   Flexeril [cyclobenzaprine]; Naproxen; Tramadol; and Hydrocodone   Review of Systems Review of Systems Level V caveat cannot obtain review of systems due to patient agitation and confusion  Physical Exam Updated Vital Signs BP (!) 90/57   Pulse 74   Resp 15   LMP  (LMP Unknown)   SpO2 93%   Physical Exam  Constitutional:  Patient is actively moving all 4 extremities and trying to resist evaluation. Respirations are nonlabored but patient is very confused keeping eyes closed.  HENT:  Head: Normocephalic and atraumatic.  Right Ear: External ear normal.  Left Ear: External ear normal.  Very poor dentition. Mucous membranes are dry. No pooling of secretions in the airway.  Eyes:  Extraocular motions are slightly roving. Occasionally patient will briefly open eyes to command. Pupils are symmetric.  Neck: Neck supple.  Cardiovascular:  Heart is regular. Tachycardic with patient resisting exam and physically  exerting herself. Cannot appreciate rub murmur gallop.  Pulmonary/Chest:  No sonorous respirations. Breath sounds are clear bilaterally.  Abdominal: Soft. She exhibits no distension.  Musculoskeletal: Normal range of motion. She exhibits no deformity.  Patient is using all 4 extremities with excellent strength to resist evaluation and examination. No obvious deformities or acute injuries to extremities. Patient has a macerated appearing wound on the left forearm that is approximately 4 cm and covered with a dressing. No obvious lower extremity wounds to the ankles feet.  Neurological:  Patient is very confused and agitated.  She will very briefly be asleep and then with eyes closed resisting and trying to get up.  Skin: Skin is warm and dry.     ED Treatments / Results  Labs (all labs ordered are listed, but only abnormal results are displayed) Labs Reviewed  COMPREHENSIVE METABOLIC PANEL - Abnormal; Notable for the following:       Result Value   Potassium 3.1 (*)    Glucose, Bld 119 (*)    Calcium 7.7 (*)    Albumin 2.9 (*)    AST 69 (*)    ALT 60 (*)    All other components within normal limits  ACETAMINOPHEN LEVEL - Abnormal; Notable for the following:    Acetaminophen (Tylenol), Serum <10 (*)    All other components within normal limits  RAPID URINE DRUG SCREEN, HOSP PERFORMED - Abnormal; Notable for the following:    Opiates POSITIVE (*)    Cocaine POSITIVE (*)    Benzodiazepines POSITIVE (*)    All other components within normal limits  CBC WITH DIFFERENTIAL/PLATELET - Abnormal; Notable for the following:    RBC 3.30 (*)    Hemoglobin 9.2 (*)    HCT 28.8 (*)    All other components within normal limits  CBG MONITORING, ED - Abnormal; Notable for the following:    Glucose-Capillary 143 (*)    All other components within normal limits  SALICYLATE LEVEL  ETHANOL  URINALYSIS, ROUTINE W REFLEX MICROSCOPIC  I-STAT BETA HCG BLOOD, ED ([facility], [facility], [facility] ONLY)    EKG  EKG Interpretation  Date/Time:  Monday February 13 2017 16:24:05 EDT Ventricular Rate:  129 PR Interval:    QRS Duration: 87 QT Interval:  355 QTC Calculation: 521 R Axis:   72 Text Interpretation:  Sinus tachycardia Probable left atrial enlargement Repol abnrm suggests ischemia, diffuse leads Prolonged QT interval Confirmed by Arby Barrette [id number]) on 02/13/2017 7:24:39 PM       Radiology Ct Head Wo Contrast  Result Date: 02/13/2017 CLINICAL DATA:  Possible heroin overdose, seen stumbling down the road half naked, passed out the site of the road, and decreased breathing, altered level of consciousness EXAM: CT HEAD WITHOUT  CONTRAST TECHNIQUE: Contiguous axial images were obtained from the base of the skull through the vertex without intravenous contrast. COMPARISON:  None FINDINGS: Brain: Normal ventricular morphology. No midline shift or mass effect. Normal appearance of brain parenchyma. No intracranial hemorrhage, mass lesion, evidence of acute infarction, or extra-axial fluid collection. Scattered dural calcifications in falx. Vascular: Normal appearance Skull: Normal appearance Sinuses/Orbits: Clear Other: N/A IMPRESSION: No acute intracranial abnormalities. Electronically Signed   By: Ulyses Southward M.D.   On: 02/13/2017 18:26    Procedures Procedures (including critical care time) CRITICAL CARE Performed by: Arby Barrette   Total critical care time: 40 minutes  Critical care time was exclusive of separately billable procedures and treating other patients.  Critical care was necessary to treat or  prevent imminent or life-threatening deterioration.  Critical care was time spent personally by me on the following activities: development of treatment plan with patient and/or surrogate as well as nursing, discussions with consultants, evaluation of patient's response to treatment, examination of patient, obtaining history from patient or surrogate, ordering and performing treatments and interventions, ordering and review of laboratory studies, ordering and review of radiographic studies, pulse oximetry and re-evaluation of patient's condition. Medications Ordered in ED Medications  0.9 %  sodium chloride infusion ( Intravenous New Bag/Given 02/13/17 1637)  LORazepam (ATIVAN) injection 2 mg (2 mg Intravenous Given 02/13/17 1636)  HYDROmorphone (DILAUDID) injection 2 mg (2 mg Intravenous Given 02/13/17 1720)     Initial Impression / Assessment and Plan / ED Course  I have reviewed the triage vital signs and the nursing notes.  Pertinent labs & imaging results that were available during my care of the patient were  reviewed by me and considered in my medical decision making (see chart for details).    Consult: Dr. Antionette Char for admission Final Clinical Impressions(s) / ED Diagnoses   Final diagnoses:  Narcotic overdose, undetermined intent, initial encounter [facility])  Polysubstance abuse [facility])  Disorientation  Patient had findings consistent with narcotic\heroin overdose upon EMS arrival. She became very agitated after Narcan. Patient dictation was not resolved with Versed 6 mg administered by EMS and an additional Ativan 2 mg administered the emergency department. Patient could not be treated and assessed and treated safely due to combativeness and agitation. She was given Dilaudid 2 mg IV which resolved combativeness and agitation. Patient is been closely monitored with stable heart rate and blood pressure and oxygen saturation 93% or greater.Dilaudid administered because patient's ED EKG showed QT prolongation, which was not present when she was at a normal rate on EMS EKG. I felt it was safer to treat the agitated opioid withdrawal in a chronic opioid user and allow the benzodiazepines used for sedation to resolve over time with close monitoring. Patient will have to have ongoing monitoring on stepdown unit until mental status is clear. Plan for admission.  New Prescriptions New Prescriptions   No medications on file     Arby Barrette, MD 02/13/17 Bary Leriche, MD 02/13/17 [phone]

## 2017-02-14 DIAGNOSIS — T401X4A Poisoning by heroin, undetermined, initial encounter: Secondary | ICD-10-CM

## 2017-02-14 DIAGNOSIS — R410 Disorientation, unspecified: Secondary | ICD-10-CM

## 2017-02-14 LAB — IRON AND TIBC
Iron: 62 ug/dL (ref 28–170)
SATURATION RATIOS: 21 % (ref 10.4–31.8)
TIBC: 294 ug/dL (ref 250–450)
UIBC: 232 ug/dL

## 2017-02-14 LAB — CBC
HEMATOCRIT: 32.2 % — AB (ref 36.0–46.0)
Hemoglobin: 10.3 g/dL — ABNORMAL LOW (ref 12.0–15.0)
MCH: 28.1 pg (ref 26.0–34.0)
MCHC: 32 g/dL (ref 30.0–36.0)
MCV: 87.7 fL (ref 78.0–100.0)
PLATELETS: 232 10*3/uL (ref 150–400)
RBC: 3.67 MIL/uL — AB (ref 3.87–5.11)
RDW: 15.3 % (ref 11.5–15.5)
WBC: 6.9 10*3/uL (ref 4.0–10.5)

## 2017-02-14 LAB — COMPREHENSIVE METABOLIC PANEL
ALT: 60 U/L — AB (ref 14–54)
AST: 64 U/L — AB (ref 15–41)
Albumin: 3 g/dL — ABNORMAL LOW (ref 3.5–5.0)
Alkaline Phosphatase: 44 U/L (ref 38–126)
Anion gap: 8 (ref 5–15)
BILIRUBIN TOTAL: 0.7 mg/dL (ref 0.3–1.2)
BUN: 12 mg/dL (ref 6–20)
CALCIUM: 8.6 mg/dL — AB (ref 8.9–10.3)
CHLORIDE: 108 mmol/L (ref 101–111)
CO2: 24 mmol/L (ref 22–32)
CREATININE: 0.61 mg/dL (ref 0.44–1.00)
Glucose, Bld: 100 mg/dL — ABNORMAL HIGH (ref 65–99)
Potassium: 3.8 mmol/L (ref 3.5–5.1)
Sodium: 140 mmol/L (ref 135–145)
TOTAL PROTEIN: 7 g/dL (ref 6.5–8.1)

## 2017-02-14 LAB — FOLATE: FOLATE: 9.4 ng/mL (ref >5.9)

## 2017-02-14 LAB — VITAMIN B12: VITAMIN B 12: 617 pg/mL (ref 180–914)

## 2017-02-14 LAB — FERRITIN: Ferritin: 119 ng/mL (ref 11–307)

## 2017-02-14 LAB — HIV ANTIBODY (ROUTINE TESTING W REFLEX): HIV Screen 4th Generation wRfx: NONREACTIVE

## 2017-02-14 NOTE — Discharge Summary (Addendum)
Physician Discharge Summary  Ann Guzman MRN:[id number] DOB: Jul 11, 1970 DOA: 02/13/2017  PCP: Patient, No Pcp Per  Admit date: 02/13/2017 Discharge date: 02/14/2017  Admitted From: Home Disposition: Home   Recommendations for Outpatient Follow-up:  1. Establish care with PCP.  2. Needs hepatitis C NAAT and consideration of treatment (Hep C Ab positive) 3. Enroll in recovery services as outpatient.   Home Health: None Equipment/Devices: None Discharge Condition: Stable CODE STATUS: Full Diet recommendation: Heart healthy  Brief/Interim Summary:  Ann Guzman is a 45 y.o. female with medical history significant for depression, anxiety, and polysubstance abuse, presenting to the emergency department after EMS was called out by passerbys who saw her staggering on the road half dressed. She was very poorly responsive upon EMS arrival, reportedly breathing approximately 6 times per minute. She was treated with Narcan on the scene and became very agitated. She was then treated with Versed and brought into the ED. She reported to EMS that she had taken heroin shortly prior to this. Several syringes were found with the patient.  ED Course: Upon arrival to the ED, patient is found to be slightly hypoxic, normalizing with 3 L per minute of supplemental oxygen, and with vitals otherwise stable. EKG features a sinus tachycardia with rate 129, diffuse repolarization abnormality, and QTc interval prolonged 521 ms. Noncontrast head CT is negative for acute intracranial abnormality. Chemistry panels notable for potassium of 3.1 and AST and ALT both 60. CBC features a normocytic anemia with hemoglobin of 9.2 and no recent priors available for comparison. Urinalysis is unremarkable, UDS is positive for benzodiazepines, opiates, and cocaine. Acetaminophen and salicylate levels are undetectable. Patient was treated with 2 mg of Ativan in the emergency department and started on IV fluids. She continued to be  very agitated, risking harm to herself and staff, and she was treated with 2 mg IV Dilaudid. She then became very somnolent, but hemodynamically stable and with adequate respirations. She was brought in for observation due to somnolence and stabilized. She is ambulating, eating and wants to go home. She has declined recovery services at this time.   Discharge Diagnoses:  Principal Problem:   Heroin overdose (HCC) Active Problems:   Hypokalemia   Normocytic anemia   Prolonged QT interval   Agitation  Agitation Likely secondary to detox in setting of Narcan administration. Resolved.  - Pt was treated with Versed in the field, 2 mg IV Ativan in ED, but continued to be very agitated and combative - Given the prolonged QT interval, antipsychotics were avoided and she was treated with Dilaudid in ED  - Pt is somnolent after Dilaudid was administered, but recovered to mental baseline.   Heroin overdose Pt staggering in road only partially clothed, breathing 6/min on EMS arrival. Reported to EMS that she had taken heroin, UDS also positive for cocaine and benzodiazepines. She was treated with Narcan by EMS, resulting in extreme agitation as discussed above. - Social work consulted, declined assistance by patient.  - Strongly encouraged cessation and outpatient resources provided.   Hypokalemia: Resolved with replacement.   Anemia: Hgb 9.2 > 10.3 without intervention, no priors for comparison. Anemia panel within normal limits.  - Monitor at follow up.    Prolonged QT interval: QTc interval 521 msec on admission, improved to <[facility].  - Hypokalemia and heroin likely contributing   Discharge Instructions Discharge Instructions    Diet - low sodium heart healthy    Complete by:  As directed  Discharge instructions    Complete by:  As directed    You were admitted for a heroin overdose and have stabilized. You are medically stable for discharge. It is recommended that you  stop using heroin and present to a treatment facility. Social work will provide you with a list of these. If you experience any fevers, chest pain, trouble breathing, seek medical attention.   Increase activity slowly    Complete by:  As directed      Allergies as of 02/14/2017      Reactions   Flexeril [cyclobenzaprine] Nausea And Vomiting   Naproxen Nausea And Vomiting   Tramadol Hives   Hydrocodone Itching, Rash      Medication List    TAKE these medications   acetaminophen 500 MG tablet Commonly known as:  TYLENOL Take 1,000 mg by mouth every 6 (six) hours as needed for mild pain or headache.      Follow-up Information    [facility] Follow up.   Contact information: [address] [place] [address] [phone]         Allergies  Allergen Reactions  . Flexeril [Cyclobenzaprine] Nausea And Vomiting  . Naproxen Nausea And Vomiting  . Tramadol Hives  . Hydrocodone Itching and Rash    Consultations:  None  Procedures/Studies: Ct Head Wo Contrast  Result Date: 02/13/2017 CLINICAL DATA:  Possible heroin overdose, seen stumbling down the road half naked, passed out the site of the road, and decreased breathing, altered level of consciousness EXAM: CT HEAD WITHOUT CONTRAST TECHNIQUE: Contiguous axial images were obtained from the base of the skull through the vertex without intravenous contrast. COMPARISON:  None FINDINGS: Brain: Normal ventricular morphology. No midline shift or mass effect. Normal appearance of brain parenchyma. No intracranial hemorrhage, mass lesion, evidence of acute infarction, or extra-axial fluid collection. Scattered dural calcifications in falx. Vascular: Normal appearance Skull: Normal appearance Sinuses/Orbits: Clear Other: N/A IMPRESSION: No acute intracranial abnormalities. Electronically Signed   By: Ulyses Southward M.D.   On: 02/13/2017 18:26   Subjective: Eating, walking, wants to go  home.   Discharge Exam: Vitals:   02/14/17 1100 02/14/17 1129  BP: (!) 126/107 112/68  Pulse: 69   Resp: (!) 25 15  Temp:    SpO2: 96%    General: Pt is alert, awake, not in acute distress Cardiovascular: RRR, S1/S2 +, no rubs, no gallops Respiratory: CTA bilaterally, no wheezing, no rhonchi Abdominal: Soft, NT, ND, bowel sounds + Extremities: No edema, no cyanosis Neuro: No focal deficits.  Psych: Dierdre Highman but cooperative. Not responding to internal stimuli.  Labs: BNP (last 3 results) No results for input(s): BNP in the last 8760 hours. Basic Metabolic Panel:  Recent Labs Lab 02/13/17 1719 02/14/17 0339  NA 141 140  K 3.1* 3.8  CL 108 108  CO2 22 24  GLUCOSE 119* 100*  BUN 11 12  CREATININE 0.66 0.61  CALCIUM 7.7* 8.6*   Liver Function Tests:  Recent Labs Lab 02/13/17 1719 02/14/17 0339  AST 69* 64*  ALT 60* 60*  ALKPHOS 45 44  BILITOT 0.4 0.7  PROT 6.6 7.0  ALBUMIN 2.9* 3.0*   No results for input(s): LIPASE, AMYLASE in the last 168 hours. No results for input(s): AMMONIA in the last 168 hours. CBC:  Recent Labs Lab 02/13/17 1719 02/14/17 0339  WBC 7.6 6.9  NEUTROABS 5.6  --   HGB 9.2* 10.3*  HCT 28.8* 32.2*  MCV 87.3 87.7  PLT 248 232   Cardiac Enzymes: No results for input(s): CKTOTAL, CKMB, CKMBINDEX, TROPONINI in the last 168 hours. BNP: Invalid input(s): POCBNP CBG:  Recent Labs Lab 02/13/17 1700  GLUCAP 143*   D-Dimer No results for input(s): DDIMER in the last 72 hours. Hgb A1c No results for input(s): HGBA1C in the last 72 hours. Lipid Profile No results for input(s): CHOL, HDL, LDLCALC, TRIG, CHOLHDL, LDLDIRECT in the last 72 hours. Thyroid function studies No results for input(s): TSH, T4TOTAL, T3FREE, THYROIDAB in the last 72 hours.  Invalid input(s): FREET3 Anemia work up  Recent Labs  02/13/17 2136  VITAMINB12 617  FOLATE 9.4  FERRITIN 119  TIBC 294  IRON 62  RETICCTPCT 1.4   Urinalysis    Component  Value Date/Time   COLORURINE YELLOW 02/13/2017 1632   APPEARANCEUR CLEAR 02/13/2017 1632   LABSPEC 1.015 02/13/2017 1632   PHURINE 5.0 02/13/2017 1632   GLUCOSEU NEGATIVE 02/13/2017 1632   HGBUR NEGATIVE 02/13/2017 1632   BILIRUBINUR NEGATIVE 02/13/2017 1632   KETONESUR NEGATIVE 02/13/2017 1632   PROTEINUR NEGATIVE 02/13/2017 1632   UROBILINOGEN 0.2 06/18/2013 1123   NITRITE NEGATIVE 02/13/2017 1632   LEUKOCYTESUR NEGATIVE 02/13/2017 1632    Microbiology Recent Results (from the past 240 hour(s))  MRSA PCR Screening     Status: None   Collection Time: 02/13/17  9:28 PM  Result Value Ref Range Status   MRSA by PCR NEGATIVE NEGATIVE Final    Comment:        The GeneXpert MRSA Assay (FDA approved for NASAL specimens only), is one component of a comprehensive MRSA colonization surveillance program. It is not intended to diagnose MRSA infection nor to guide or monitor treatment for MRSA infections.     Time coordinating discharge: Approximately 40 minutes  Hazeline Junker, MD  [facility] 02/14/2017, 3:22 PM Pager [phone]

## 2017-02-14 NOTE — Progress Notes (Signed)
Pt awake, oriented to person and place, time but not situation,  incont. of urine x 3 and stool x 1 even though she is awake.  Stated she needs something for withdrawal , HR 70's , B/P normal, no visible tremors.  Ativan given x 1

## 2017-02-14 NOTE — Clinical Social Work Note (Signed)
Clinical Social Work Assessment  Patient Details  Name: Ann Guzman MRN: [id number] Date of Birth: 23-Sep-1970  Date of referral:  02/14/17               Reason for consult:  Substance Use/ETOH Abuse                Permission sought to share information with:  Family Supports Permission granted to share information::  Yes, Verbal Permission Granted  Name::        Agency::     Relationship::     Contact Information:     Housing/Transportation Living arrangements for the past 2 months:  Apartment Source of Information:  Patient Patient Interpreter Needed:  None Criminal Activity/Legal Involvement Pertinent to Current Situation/Hospitalization:  No - Comment as needed Significant Relationships:  None Lives with:  Parents, Siblings Do you feel safe going back to the place where you live?  Yes Need for family participation in patient care:  Yes (Comment)  Care giving concerns:  Illicit Drug use, Agitation , heroin Overdose s/p Narcan Patient has medical history for depression, anxiety, and polysubstance abuse. Patient presented to EMS, patient was found [place] passerby who saw her staggering on the road half dressed.  Patient reports, "I was about to overdose and they brought me to the  hospital."  Patient reports she is from [place], [place] and came to be with a friend. Patient reports, " I want to go home, I want to get out of here."  Facilities manager / plan: CSW met with the patient at bedside, explain role and reason for visit. Patient alert and oriented. CSW offered SA treatment for patient reports she is interested in treatment. CSW discussed options with the patient but patient quickly stated " I am ready to go home. When can I leave." Patient did not allow CSW to finish discussing treatment option. CSW will attempt to follow up again with treatment options, if the patient allows.  Employment status:  Unemployed Forensic scientist:  Self Pay (Medicaid Pending) PT  Recommendations:  Not assessed at this time Information / Referral to community resources:  Outpatient Substance Abuse Treatment Options, Residential Substance Abuse Treatment Options  Patient/Family's Response to care:  Agreeable and Responding well to care.   Patient/Family's Understanding of and Emotional Response to Diagnosis, Current Treatment, and Prognosis: Patient is requesting to leave, did not allow CSW to finish providing information for SA treatment.  Emotional Assessment Appearance:    Attitude/Demeanor/Rapport:    Affect (typically observed):  Agitated, Irritable Orientation:  Oriented to Self, Oriented to Place, Oriented to  Time, Oriented to Situation Alcohol / Substance use:  Illicit Drugs, Tobacco Use, Alcohol Use Psych involvement (Current and /or in the community):  No (Comment)  Discharge Needs  Concerns to be addressed:  Discharge Planning Concerns, Care Coordination Readmission within the last 30 days:  Yes Current discharge risk:  Substance Abuse Barriers to Discharge:  Continued Medical Work up   [employer], LCSW 02/14/2017, 1:36 PM

## 2017-02-14 NOTE — Care Management Note (Signed)
Case Management Note  Patient Details  Name: Ann Guzman MRN: [id number] Date of Birth: 11/02/1970  Subjective/Objective:                  Substance abuse and hypoxia  Action/Plan: Date:  February 14, 2017 Chart reviewed for concurrent status and case management needs.  Will continue to follow patient progress.  Discharge Planning: following for needs  Expected discharge date: [id number]  Marcelle Smiling, BSN, [place], [place]   [phone]   Expected Discharge Date:   (unknown)               Expected Discharge Plan:  Home/Self Care  In-House Referral:     Discharge planning Services  CM Consult  Post Acute Care Choice:    Choice offered to:     DME Arranged:    DME Agency:     HH Arranged:    HH Agency:     Status of Service:  In process, will continue to follow  If discussed at Long Length of Stay Meetings, dates discussed:    Additional Comments:  Golda Acre, RN 02/14/2017, 8:34 AM

## 2017-02-15 LAB — HEPATITIS PANEL, ACUTE
HEP B S AG: NEGATIVE
Hep A IgM: NEGATIVE
Hep B C IgM: NEGATIVE

## 2018-09-18 ENCOUNTER — Emergency Department (HOSPITAL_COMMUNITY)
Admission: EM | Admit: 2018-09-18 | Discharge: 2018-09-18 | Disposition: A | Discharge status: Home / Self Care | Payer: HRSA Program | Attending: Emergency Medicine | Admitting: Emergency Medicine

## 2018-09-18 ENCOUNTER — Encounter (HOSPITAL_COMMUNITY): Payer: Self-pay | Admitting: Emergency Medicine

## 2018-09-18 ENCOUNTER — Other Ambulatory Visit: Payer: Self-pay

## 2018-09-18 DIAGNOSIS — F419 Anxiety disorder, unspecified: Secondary | ICD-10-CM | POA: Insufficient documentation

## 2018-09-18 DIAGNOSIS — Z20828 Contact with and (suspected) exposure to other viral communicable diseases: Secondary | ICD-10-CM | POA: Diagnosis not present

## 2018-09-18 DIAGNOSIS — R059 Cough, unspecified: Secondary | ICD-10-CM

## 2018-09-18 DIAGNOSIS — J029 Acute pharyngitis, unspecified: Secondary | ICD-10-CM | POA: Insufficient documentation

## 2018-09-18 DIAGNOSIS — F329 Major depressive disorder, single episode, unspecified: Secondary | ICD-10-CM | POA: Diagnosis not present

## 2018-09-18 DIAGNOSIS — R05 Cough: Secondary | ICD-10-CM

## 2018-09-18 LAB — SARS CORONAVIRUS 2 BY RT PCR (HOSPITAL ORDER, PERFORMED IN ~~LOC~~ HOSPITAL LAB): SARS Coronavirus 2: NEGATIVE

## 2018-09-18 NOTE — Discharge Instructions (Signed)
Your test was negative for Covid 19, you may take tylenol or ibuprofen for pain or aches or sore throat  Thank you for letting us take care of you today!  Please obtain all of your results from medical records or have your doctors office obtain the results - share them with your doctor - you should be seen at your doctors office in the next 2 days. Call today to arrange your follow up. Take the medications as prescribed. Please review all of the medicines and only take them if you do not have an allergy to them. Please be aware that if you are taking birth control pills, taking other prescriptions, ESPECIALLY ANTIBIOTICS may make the birth control ineffective - if this is the case, either do not engage in sexual activity or use alternative methods of birth control such as condoms until you have finished the medicine and your family doctor says it is OK to restart them. If you are on a blood thinner such as COUMADIN, be aware that any other medicine that you take may cause the coumadin to either work too much, or not enough - you should have your coumadin level rechecked in next 7 days if this is the case.  ?  It is also a possibility that you have an allergic reaction to any of the medicines that you have been prescribed - Everybody reacts differently to medications and while MOST people have no trouble with most medicines, you may have a reaction such as nausea, vomiting, rash, swelling, shortness of breath. If this is the case, please stop taking the medicine immediately and contact your physician.   If you were given a medication in the ED such as percocet, vicodin, or morphine, be aware that these medicines are sedating and may change your ability to take care of yourself adequately for several hours after being given this medicines - you should not drive or take care of small children if you were given this medicine in the Emergency Department or if you have been prescribed these types of medicines. ?     You should return to the ER IMMEDIATELY if you develop severe or worsening symptoms.

## 2018-09-18 NOTE — ED Provider Notes (Signed)
[facility] Provider Note   CSN: [id number] Arrival date & time: 09/18/18  1353    History   Chief Complaint Chief Complaint  Patient presents with  . Medical Clearance    HPI Selena Lessererri A Mawson is a 48 y.o. female.     HPI  The patient is a 48 year old female, she is here in the care of the police department, as they were taking her into jail she started to complain of a sore throat and a cough.  She reports subjective fevers.  The patient states is been going on for a couple of days.  They brought her here for evaluation for possible coronavirus.  She denies any other symptoms including vomiting, diarrhea, dysuria, swelling, rashes.  She has had no medications for this illness prior to arrival.  Past Medical History:  Diagnosis Date  . Anxiety   . Depression   . Herniated disc     Patient Active Problem List   Diagnosis Date Noted  . Hypokalemia 02/13/2017  . Normocytic anemia 02/13/2017  . 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 Prolonged QT interval 02/13/2017  . Agitation 02/13/2017  . Polysubstance abuse [facility](HCC)     Past Surgical History:  Procedure Laterality Date  . TUBAL LIGATION       OB History    Gravida  4   Para  4   Term  4   Preterm      AB      Living        SAB      TAB      Ectopic      Multiple      Live Births               Home Medications    Prior to Admission medications   Medication Sig Start Date End Date Taking? Authorizing Provider  acetaminophen (TYLENOL) 500 MG tablet Take 1,000 mg by mouth every 6 (six) hours as needed for mild pain or headache.    [provider]    Family History Family History  Problem Relation Age of Onset  . Cancer Mother     Social History Social History   Tobacco Use  . Smoking status: Never Smoker  . Smokeless tobacco: Never Used  Substance Use Topics  . Alcohol use: No    Comment: 2-1/5's daily   . Drug use: No    Types: Oxycodone    Comment:  Pt using opiates--Percocet, Opana, Oxycodone; 15 pills daily      Allergies   Flexeril [cyclobenzaprine]; Naproxen; Tramadol; and Hydrocodone   Review of Systems Review of Systems  Constitutional: Positive for fever.  HENT: Positive for sore throat.   Respiratory: Positive for cough.      Physical Exam Updated Vital Signs BP 120/89   Pulse 81   Temp 98.4 F (36.9 C)   Resp 18   Ht 1.575 m (5\' 2" )   Wt 59 kg   LMP  (LMP Unknown) Comment: post menopausal  SpO2 99%   BMI 23.78 kg/m   Physical Exam Vitals signs and nursing note reviewed.  Constitutional:      General: She is not in acute distress.    Appearance: She is well-developed.  HENT:     Head: Normocephalic and atraumatic.     Mouth/Throat:     Pharynx: No oropharyngeal exudate.  Eyes:     General: No scleral icterus.       Right eye: No discharge.  Left eye: No discharge.     Conjunctiva/sclera: Conjunctivae normal.     Pupils: Pupils are equal, round, and reactive to light.  Neck:     Musculoskeletal: Normal range of motion and neck supple.     Thyroid: No thyromegaly.     Vascular: No JVD.  Cardiovascular:     Rate and Rhythm: Normal rate and regular rhythm.     Heart sounds: Normal heart sounds. No murmur. No friction rub. No gallop.   Pulmonary:     Effort: Pulmonary effort is normal. No respiratory distress.     Breath sounds: Normal breath sounds. No wheezing or rales.  Abdominal:     General: Bowel sounds are normal. There is no distension.     Palpations: Abdomen is soft. There is no mass.     Tenderness: There is no abdominal tenderness.  Musculoskeletal: Normal range of motion.        General: No tenderness.  Lymphadenopathy:     Cervical: No cervical adenopathy.  Skin:    General: Skin is warm and dry.     Findings: No erythema or rash.  Neurological:     Mental Status: She is alert.     Coordination: Coordination normal.  Psychiatric:        Behavior: Behavior normal.       ED Treatments / Results  Labs (all labs ordered are listed, but only abnormal results are displayed) Labs Reviewed  SARS CORONAVIRUS 2 (HOSPITAL ORDER, PERFORMED IN [facility])    EKG None  Radiology No results found.  Procedures Procedures (including critical care time)  Medications Ordered in ED Medications - No data to display   Initial Impression / Assessment and Plan / ED Course  I have reviewed the triage vital signs and the nursing notes.  Pertinent labs & imaging results that were available during my care of the patient were reviewed by me and considered in my medical decision making (see chart for details).        The pharynx is clear, lungs are clear, patient is well-appearing, coronavirus testing prior to discharge to jail.  She is well-appearing and I do not think she needs to be admitted to the hospital or have any other aggressive work-up.  Coronavirus test negative, stable for discharge in the care of police  Final Clinical Impressions(s) / ED Diagnoses   Final diagnoses:  None    ED Discharge Orders    None       Eber Hong, MD 09/18/18 1625

## 2018-09-18 NOTE — ED Triage Notes (Signed)
Pt brought in by [facility] for medical clearance/Covid-19 rule out. Pt c/o cough/sweating/sob x 2 days.

## 2018-10-12 IMAGING — CT CT HEAD W/O CM
3 of 4 series · 16 of 47 positions shown, 19 images · non-contrast
Comparison: None

CLINICAL DATA: Possible heroin overdose, seen stumbling down the
road half naked, passed out the site of the road, and decreased
breathing, altered level of consciousness

EXAM:
CT HEAD WITHOUT CONTRAST
TECHNIQUE: Contiguous axial images were obtained from the base of the skull
through the vertex without intravenous contrast.

[Series 2: head w/o · axial · non-contrast · 0.41mm/px · z∈[-110,+10]mm · 10 of 28 slices shown, 13 images]
[im 2/28  brain]
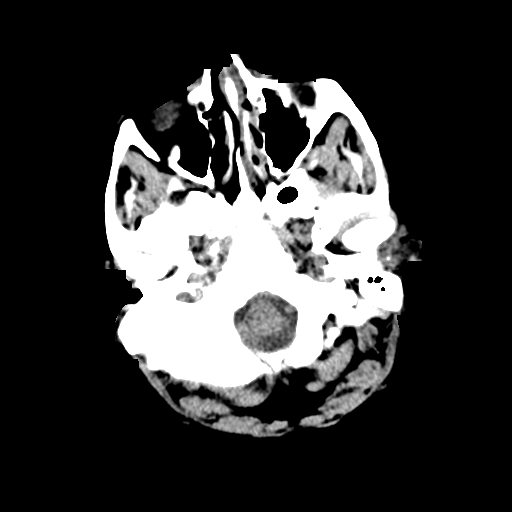
[im 2/28  bone]
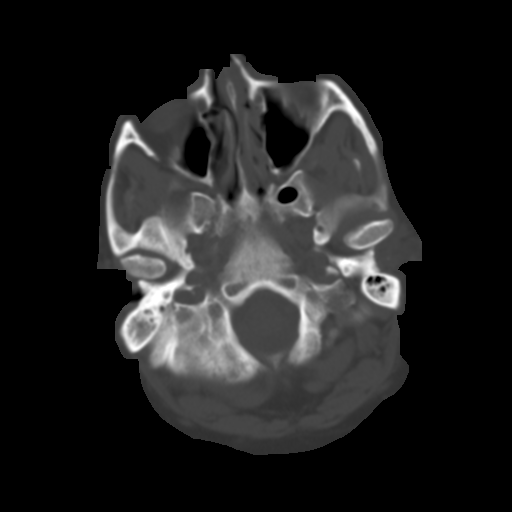
[im 4/28  brain]
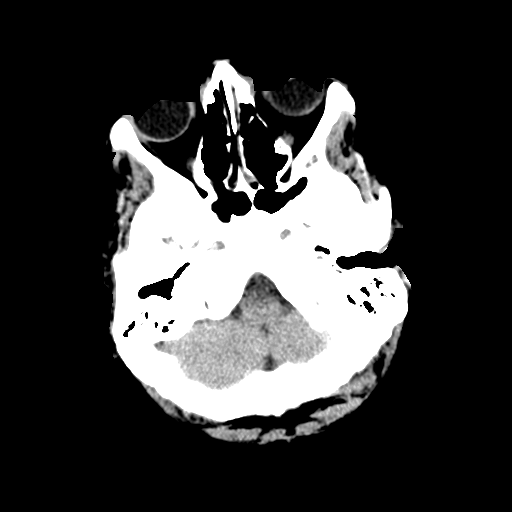
[im 8/28  brain]
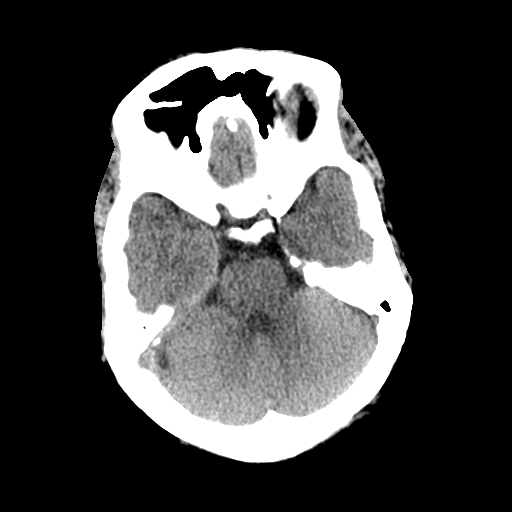
[im 10/28  brain]
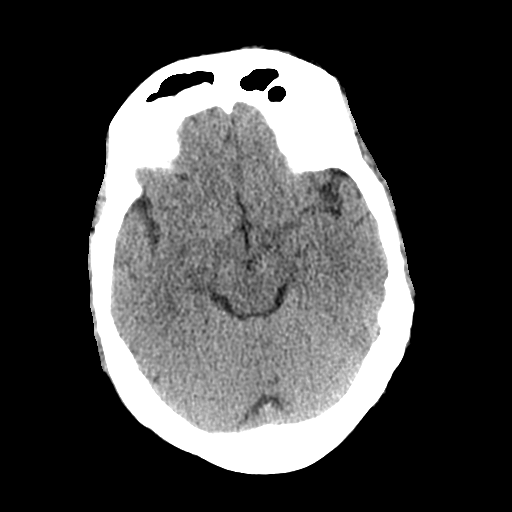
[im 12/28  brain]
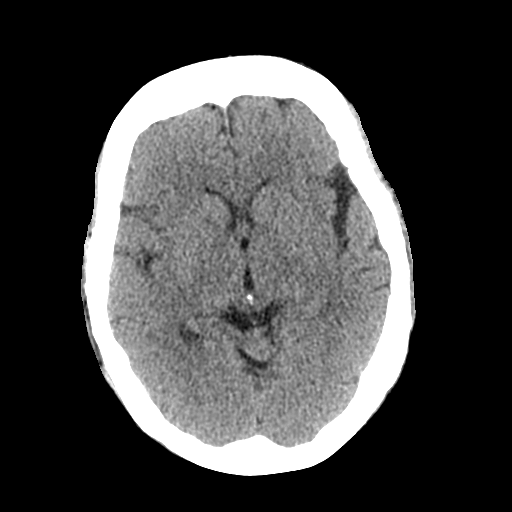
[im 12/28  bone]
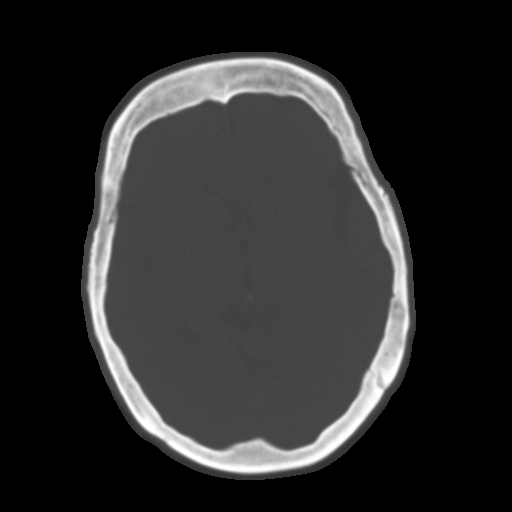
[im 16/28  brain]
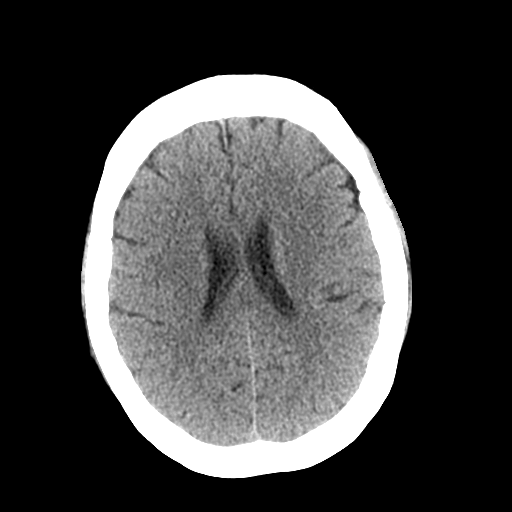
[im 18/28  brain]
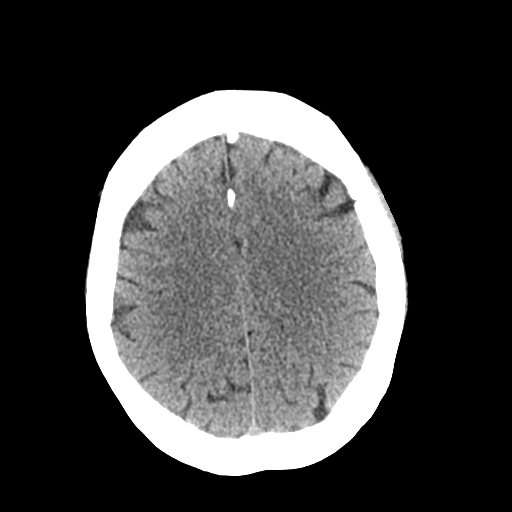
[im 20/28  brain]
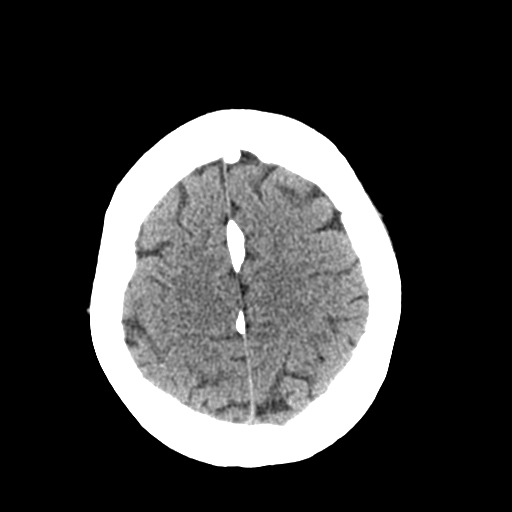
[im 24/28  brain]
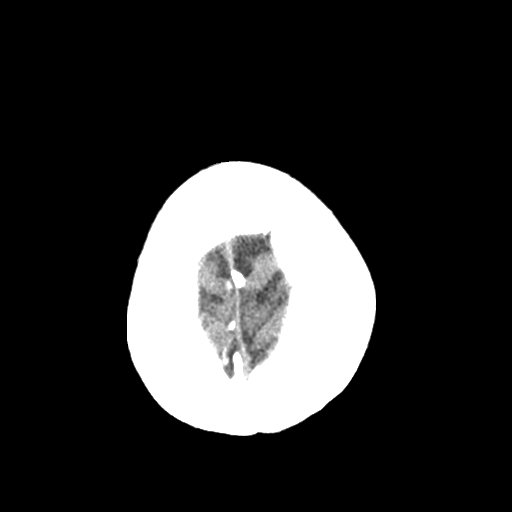
[im 24/28  bone]
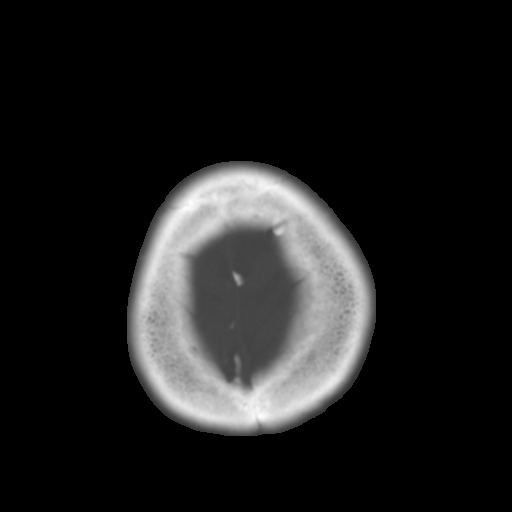
[im 26/28  brain]
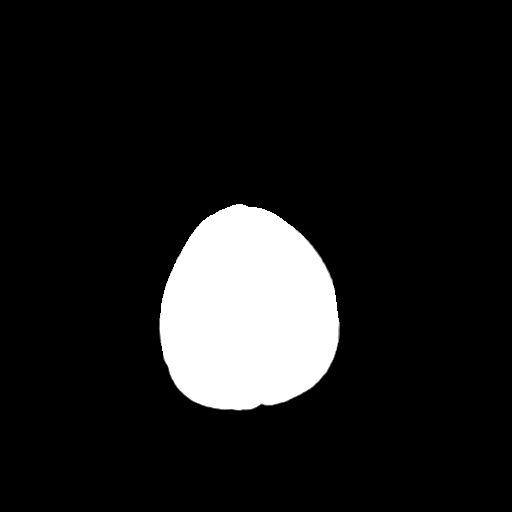

[Series 4: coronal · coronal · 0.26mm/px · 3 of 61 slices shown]
[im 21/61  brain]
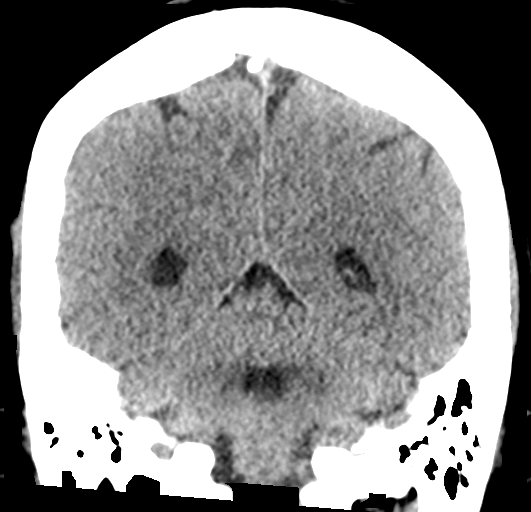
[im 27/61  brain]
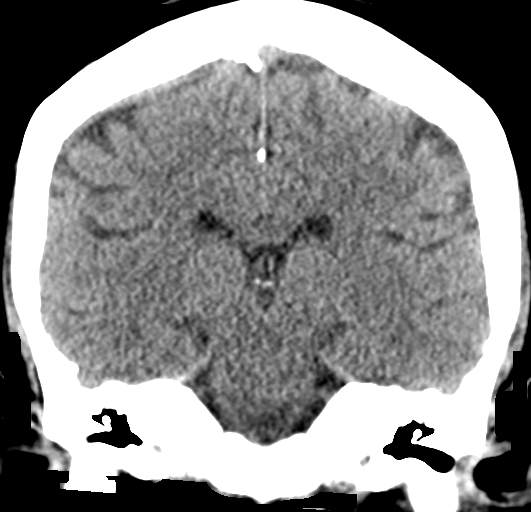
[im 34/61  brain]
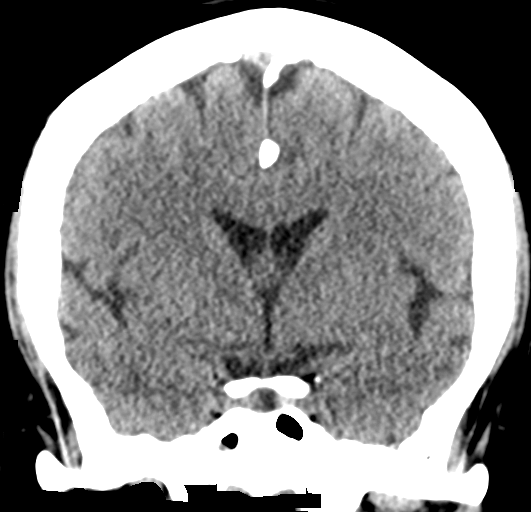

[Series 5: sagittal · sagittal · 0.26mm/px · 3 of 47 slices shown]
[im 16/47  brain]
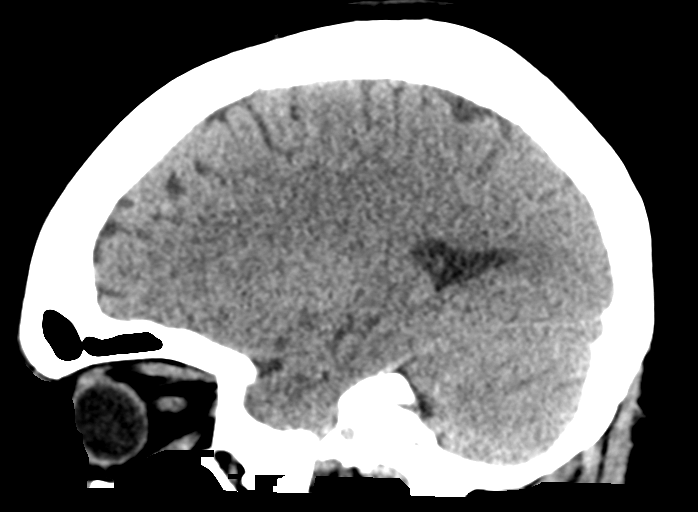
[im 24/47  brain]
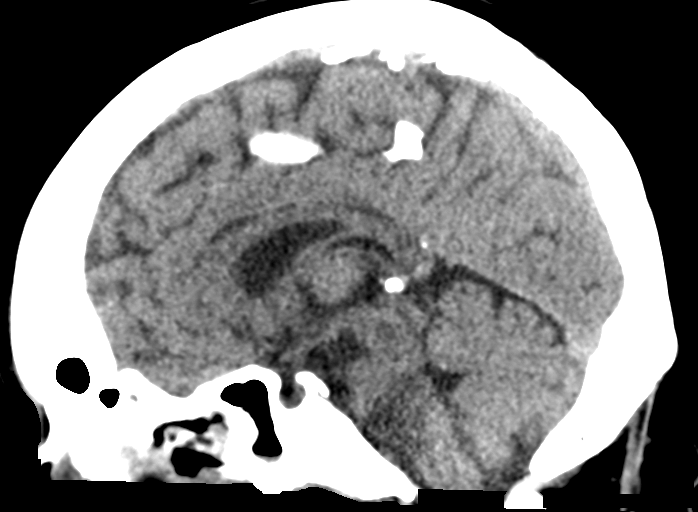
[im 31/47  brain]
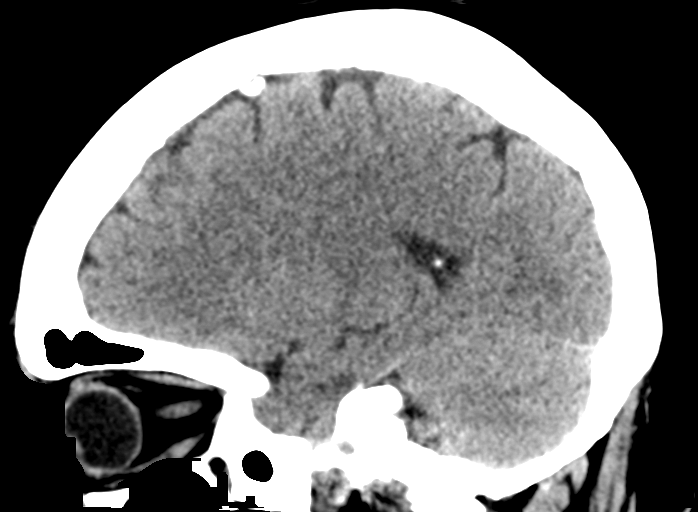

[16 of 47 positions shown; findings below may reference images not displayed]

FINDINGS: Brain: Normal ventricular morphology. No midline shift or mass
effect. Normal appearance of brain parenchyma. No intracranial
hemorrhage, mass lesion, evidence of acute infarction, or
extra-axial fluid collection. Scattered dural calcifications in
falx.

Vascular: Normal appearance

Skull: Normal appearance

Sinuses/Orbits: Clear

Other: N/A
IMPRESSION: No acute intracranial abnormalities.
